# Patient Record
Sex: Male | Born: 1965 | Race: White | Hispanic: No | Marital: Single | Smoking: Former smoker
Health system: Southern US, Community
[De-identification: ages and names within clinical notes are randomized; demographics above are authoritative.]

## PROBLEM LIST (undated history)

## (undated) DIAGNOSIS — I2510 Atherosclerotic heart disease of native coronary artery without angina pectoris: Secondary | ICD-10-CM

## (undated) DIAGNOSIS — I255 Ischemic cardiomyopathy: Secondary | ICD-10-CM

## (undated) DIAGNOSIS — E785 Hyperlipidemia, unspecified: Secondary | ICD-10-CM

## (undated) DIAGNOSIS — I472 Ventricular tachycardia: Secondary | ICD-10-CM

## (undated) DIAGNOSIS — I4729 Other ventricular tachycardia: Secondary | ICD-10-CM

## (undated) HISTORY — PX: KNEE ARTHROSCOPY: SUR90

---

## 2011-09-02 ENCOUNTER — Emergency Department (HOSPITAL_COMMUNITY): Payer: Self-pay

## 2011-09-02 ENCOUNTER — Inpatient Hospital Stay (HOSPITAL_COMMUNITY)
Admission: EM | Admit: 2011-09-02 | Discharge: 2011-09-05 | DRG: 247 | Disposition: A | Discharge status: Home / Self Care | Payer: Self-pay | Attending: Cardiology | Admitting: Cardiology

## 2011-09-02 ENCOUNTER — Encounter (HOSPITAL_COMMUNITY): Payer: Self-pay | Admitting: Emergency Medicine

## 2011-09-02 ENCOUNTER — Other Ambulatory Visit: Payer: Self-pay

## 2011-09-02 DIAGNOSIS — E785 Hyperlipidemia, unspecified: Secondary | ICD-10-CM | POA: Diagnosis present

## 2011-09-02 DIAGNOSIS — E669 Obesity, unspecified: Secondary | ICD-10-CM | POA: Diagnosis present

## 2011-09-02 DIAGNOSIS — I2510 Atherosclerotic heart disease of native coronary artery without angina pectoris: Secondary | ICD-10-CM | POA: Diagnosis present

## 2011-09-02 DIAGNOSIS — I255 Ischemic cardiomyopathy: Secondary | ICD-10-CM

## 2011-09-02 DIAGNOSIS — R7402 Elevation of levels of lactic acid dehydrogenase (LDH): Secondary | ICD-10-CM | POA: Diagnosis not present

## 2011-09-02 DIAGNOSIS — R7401 Elevation of levels of liver transaminase levels: Secondary | ICD-10-CM | POA: Diagnosis not present

## 2011-09-02 DIAGNOSIS — Z7982 Long term (current) use of aspirin: Secondary | ICD-10-CM

## 2011-09-02 DIAGNOSIS — I959 Hypotension, unspecified: Secondary | ICD-10-CM | POA: Diagnosis not present

## 2011-09-02 DIAGNOSIS — Z6829 Body mass index (BMI) 29.0-29.9, adult: Secondary | ICD-10-CM

## 2011-09-02 DIAGNOSIS — I472 Ventricular tachycardia, unspecified: Secondary | ICD-10-CM | POA: Diagnosis not present

## 2011-09-02 DIAGNOSIS — Z8249 Family history of ischemic heart disease and other diseases of the circulatory system: Secondary | ICD-10-CM

## 2011-09-02 DIAGNOSIS — I214 Non-ST elevation (NSTEMI) myocardial infarction: Principal | ICD-10-CM | POA: Diagnosis present

## 2011-09-02 DIAGNOSIS — I2589 Other forms of chronic ischemic heart disease: Secondary | ICD-10-CM | POA: Diagnosis present

## 2011-09-02 DIAGNOSIS — Z720 Tobacco use: Secondary | ICD-10-CM

## 2011-09-02 DIAGNOSIS — F17200 Nicotine dependence, unspecified, uncomplicated: Secondary | ICD-10-CM | POA: Diagnosis present

## 2011-09-02 DIAGNOSIS — Z79899 Other long term (current) drug therapy: Secondary | ICD-10-CM

## 2011-09-02 DIAGNOSIS — I4729 Other ventricular tachycardia: Secondary | ICD-10-CM | POA: Diagnosis not present

## 2011-09-02 DIAGNOSIS — Z7902 Long term (current) use of antithrombotics/antiplatelets: Secondary | ICD-10-CM

## 2011-09-02 HISTORY — DX: Atherosclerotic heart disease of native coronary artery without angina pectoris: I25.10

## 2011-09-02 HISTORY — DX: Ventricular tachycardia: I47.2

## 2011-09-02 HISTORY — DX: Other ventricular tachycardia: I47.29

## 2011-09-02 HISTORY — DX: Hyperlipidemia, unspecified: E78.5

## 2011-09-02 HISTORY — DX: Ischemic cardiomyopathy: I25.5

## 2011-09-02 LAB — BASIC METABOLIC PANEL
BUN: 13 mg/dL (ref 6–23)
Chloride: 104 mEq/L (ref 96–112)
GFR calc Af Amer: >90 mL/min (ref >90)
GFR calc non Af Amer: >90 mL/min (ref >90)
Potassium: 4.3 mEq/L (ref 3.5–5.1)
Sodium: 140 mEq/L (ref 135–145)

## 2011-09-02 LAB — CBC
Hemoglobin: 16.7 g/dL (ref 13.0–17.0)
Hemoglobin: 16.1 g/dL (ref 13.0–17.0)
MCHC: 35.7 g/dL (ref 30.0–36.0)
Platelets: 272 10*3/uL (ref 150–400)
Platelets: 248 10*3/uL (ref 150–400)
RBC: 4.94 MIL/uL (ref 4.22–5.81)
WBC: 19.7 10*3/uL — ABNORMAL HIGH (ref 4.0–10.5)

## 2011-09-02 LAB — CARDIAC PANEL(CRET KIN+CKTOT+MB+TROPI)
CK, MB: 57.8 ng/mL (ref 0.3–4.0)
Relative Index: 8.9 — ABNORMAL HIGH (ref 0.0–2.5)
Total CK: 652 U/L — ABNORMAL HIGH (ref 7–232)

## 2011-09-02 LAB — DIFFERENTIAL
Basophils Absolute: 0 10*3/uL (ref 0.0–0.1)
Basophils Relative: 0 % (ref 0–1)
Monocytes Relative: 5 % (ref 3–12)
Neutro Abs: 17.7 10*3/uL — ABNORMAL HIGH (ref 1.7–7.7)
Neutrophils Relative %: 85 % — ABNORMAL HIGH (ref 43–77)

## 2011-09-02 LAB — TROPONIN I: Troponin I: 0.34 ng/mL (ref <0.30)

## 2011-09-02 MED ORDER — NITROGLYCERIN IN D5W 200-5 MCG/ML-% IV SOLN
3.0000 ug/min | INTRAVENOUS | Status: DC
  Filled 2011-09-02: qty 250

## 2011-09-02 MED ORDER — HEPARIN BOLUS VIA INFUSION
4000.0000 [IU] | Freq: Once | INTRAVENOUS | Status: AC
  Administered 2011-09-02: 4000 [IU] via INTRAVENOUS

## 2011-09-02 MED ORDER — HEPARIN (PORCINE) IN NACL 100-0.45 UNIT/ML-% IJ SOLN
1200.0000 [IU]/h | INTRAMUSCULAR | Status: DC
  Administered 2011-09-02: 1200 [IU]/h via INTRAVENOUS
  Filled 2011-09-02: qty 250

## 2011-09-02 MED ORDER — NITROGLYCERIN 2 % TD OINT
1.0000 [in_us] | TOPICAL_OINTMENT | Freq: Once | TRANSDERMAL | Status: AC
  Administered 2011-09-02: 1 [in_us] via TOPICAL
  Filled 2011-09-02: qty 1

## 2011-09-02 MED ORDER — ATORVASTATIN CALCIUM 10 MG PO TABS
10.0000 mg | ORAL_TABLET | Freq: Every day | ORAL | Status: DC
  Filled 2011-09-02: qty 1

## 2011-09-02 MED ORDER — HEPARIN BOLUS VIA INFUSION: 60.0000 [IU]/kg | Freq: Once | INTRAVENOUS | Status: DC

## 2011-09-02 MED ORDER — HEPARIN (PORCINE) IN NACL 100-0.45 UNIT/ML-% IJ SOLN: 16.0000 [IU]/kg/h | INTRAMUSCULAR | Status: DC

## 2011-09-02 MED ORDER — BIOTENE DRY MOUTH MT LIQD
15.0000 mL | Freq: Two times a day (BID) | OROMUCOSAL | Status: DC
  Administered 2011-09-02 – 2011-09-04 (×5): 15 mL via OROMUCOSAL

## 2011-09-02 MED ORDER — NITROGLYCERIN 0.4 MG SL SUBL: 0.4000 mg | SUBLINGUAL_TABLET | SUBLINGUAL | Status: DC | PRN

## 2011-09-02 MED ORDER — METOPROLOL TARTRATE 25 MG PO TABS
25.0000 mg | ORAL_TABLET | Freq: Two times a day (BID) | ORAL | Status: DC
Start: 2011-09-02 — End: 2011-09-03
  Administered 2011-09-02 – 2011-09-03 (×2): 25 mg via ORAL
  Filled 2011-09-02 (×3): qty 1

## 2011-09-02 MED ORDER — ACETAMINOPHEN 325 MG PO TABS: 325.0000 mg | ORAL_TABLET | Freq: Four times a day (QID) | ORAL | Status: DC | PRN

## 2011-09-02 MED ORDER — ASPIRIN 81 MG PO CHEW
324.0000 mg | CHEWABLE_TABLET | ORAL | Status: AC
  Administered 2011-09-02: 324 mg via ORAL
  Filled 2011-09-02: qty 4

## 2011-09-02 MED ORDER — ASPIRIN EC 81 MG PO TBEC
81.0000 mg | DELAYED_RELEASE_TABLET | Freq: Every day | ORAL | Status: DC
  Administered 2011-09-03 – 2011-09-05 (×3): 81 mg via ORAL
  Filled 2011-09-02 (×3): qty 1

## 2011-09-02 MED ORDER — ONDANSETRON HCL 4 MG/2ML IJ SOLN: 4.0000 mg | Freq: Four times a day (QID) | INTRAMUSCULAR | Status: DC | PRN

## 2011-09-02 NOTE — ED Notes (Signed)
Patient states that he did break out in a sweat with the onset of pain. At this time he is warm and dry, denies nausea and vomiting.

## 2011-09-02 NOTE — ED Provider Notes (Signed)
46 year old male developed chest pressure while doing some heavy lifting. It was severe and rated at 8/10. There no associated symptoms. It has now completely resolved. He does have significant cardiac risk factors of smoking and a positive family history of premature cardiac disease in that his father died at age 66 of an myocardial infarction. ECG does not have any definite ischemic changes although there is T-wave inversion in lead 3. He is a high enough risk patient that he will need to be evaluated in the hospital or in CDU.  ECG shows normal sinus rhythm with a rate of 72, no ectopy. Normal axis. Normal P wave. Normal QRS. Normal intervals. Normal ST and T waves. Impression: normal ECG. No old ECG available for comparison.   Dione Booze, MD 09/02/11 [phone]

## 2011-09-02 NOTE — Progress Notes (Signed)
ANTICOAGULATION CONSULT NOTE - Initial Consult  Pharmacy Consult for Heparin  Indication: chest pain/ACS  No Known Allergies  Patient Measurements: Height: 5\' 11"  (180.3 cm) Weight: 205 lb (92.987 kg) IBW/kg (Calculated) : 75.3  Heparin Dosing Weight: 90 kg  Vital Signs: BP: 121/88 mmHg (04/06 1815) Pulse Rate: 73  (04/06 1815)  Labs:  Basename 09/02/11 1820  HGB 16.7  HCT 46.8  PLT 272  APTT --  LABPROT --  INR --  HEPARINUNFRC --  CREATININE 0.77  CKTOTAL --  CKMB --  TROPONINI --   Estimated Creatinine Clearance: 134.5 ml/min (by C-G formula based on Cr of 0.77).  Medical History: History reviewed. No pertinent past medical history.  Medications:  Scheduled:    . heparin  4,000 Units Intravenous Once  . nitroGLYCERIN  1 inch Topical Once  . DISCONTD: heparin  60 Units/kg Intravenous Once  . DISCONTD: heparin  60 Units/kg Intravenous Once    Assessment: Pt admitted with chest pain. No hx bleeding noted.   Goal of Therapy:  Heparin level 0.3-0.7 units/ml   Plan:  1) Start heparin 4000 units IV bolus x1 then infusion at 1200 units/hr per ACS protocol 2) Check 6 hr heparin level 3) Daily heparin level and CBC with routine platelet monitoring per protocol.  Elson Clan 09/02/2011,7:21 PM

## 2011-09-02 NOTE — ED Notes (Signed)
Received pt from home with c/o intermittant central chest pain after doing heavy lifting of furniture this morning. Episode began around 1230. Pain appears to be positional. Per EMS pt noted to have ECG changes with chest pain episodes.

## 2011-09-02 NOTE — H&P (Signed)
Admit date: 09/02/2011 Referring Physician  Dr. Preston Fleeting  Chief complaint/reason for admission:  Chest pain   HPI: This is a 45yo WM with no PMH but a family history fo CAD with his dad who presented to the ER with complaints of chest pain.  He says that he was in his USOH until 12:30pm and developed SSCP that is a pressure in midsternal without radiation.  He denies any SOB, nausea or vomiting.  He was diaphoretic but was moving furniture at the time.  He thinks the pain lasted about 35 minutes and he called EMS and was given NTG with no relief.  He currently has some minimal chest pain 1/10.   PMH:   History reviewed. No pertinent past medical history.  PSH:    Past Surgical History  Procedure Date  . Knee arthroscopy     ALLERGIES:   Review of patient's allergies indicates no known allergies.  Prior to Admit Meds:   (Not in a hospital admission) Family HX:   History reviewed. No pertinent family history. Social HX:    History   Social History  . Marital Status: Single    Spouse Name: N/A    Number of Children: N/A  . Years of Education: N/A   Occupational History  . Not on file.   Social History Main Topics  . Smoking status: Current Everyday Smoker -- 1.0 packs/day  . Smokeless tobacco: Not on file  . Alcohol Use: No  . Drug Use: No  . Sexually Active:    Other Topics Concern  . Not on file   Social History Narrative  . No narrative on file     ROS:  All 11 ROS were addressed and are negative except what is stated in the HPI  PHYSICAL EXAM Filed Vitals:   09/02/11 1815  BP: 121/88  Pulse: 73  Resp: 16   General: Well developed, well nourished, in no acute distress Head: Eyes PERRLA, No xanthomas.   Normal cephalic and atramatic  Lungs:   Clear bilaterally to auscultation and percussion. Heart:   HRRR S1 S2 Pulses are 2+ & equal.            No carotid bruit. No JVD.  No abdominal bruits. No femoral bruits. Abdomen: Bowel sounds are positive, abdomen soft and  non-tender without masses Extremities:   No clubbing, cyanosis or edema.  DP +1 Neuro: Alert and oriented X 3. Psych:  Good affect, responds appropriately   Labs:   Lab Results  Component Value Date   WBC 19.7* 09/02/2011   HGB 16.1 09/02/2011   HCT 44.9 09/02/2011   MCV 90.9 09/02/2011   PLT 248 09/02/2011    Lab 09/02/11 1820  NA 140  K 4.3  CL 104  CO2 20  BUN 13  CREATININE 0.77  CALCIUM 9.8  PROT --  BILITOT --  ALKPHOS --  ALT --  AST --  GLUCOSE 95   Lab Results  Component Value Date   TROPONINI 0.34* 09/02/2011       Radiology:  *RADIOLOGY REPORT*  Clinical Data: Chest pain. Tobacco use.  CHEST - 2 VIEW  Comparison: None.  Findings: Linear opacities in the lingula, right middle lobe, and  lower lobes favor subsegmental atelectasis or scarring. Low lung  volumes are present, causing crowding of the pulmonary vasculature.  No pleural effusion noted. Cardiac and mediastinal contours appear  unremarkable.  Mild thoracic spondylosis is present.  IMPRESSION:  1. Low lung volumes with linear bibasilar  opacities favoring  subsegmental atelectasis or scarring.   EKG:  NSR with PAC's  ASSESSMENT:  1.  NSTEMI with elevated troponin 2.  Obesity 3.  Family history of CAD  PLAN:   1.  Admit to stepdown 2.  IV Heparin gtt 3.  IV NTG gtt 4.  ASA 5.  Lopressor 25mg  BID 6.  2D echo in am assess LVF 7.  Probable cath on Monday  Quintella Reichert, MD  09/02/2011  8:04 PM

## 2011-09-02 NOTE — ED Provider Notes (Signed)
I saw and evaluated the patient, reviewed the resident's note and I agree with the findings and plan.  CRITICAL CARE Performed by: Dione Booze   Total critical care time: 55 minutes   Critical care time was exclusive of separately billable procedures and treating other patients.  Critical care was necessary to treat or prevent imminent or life-threatening deterioration.  Critical care was time spent personally by me on the following activities: development of treatment plan with patient and/or surrogate as well as nursing, discussions with consultants, evaluation of patient's response to treatment, examination of patient, obtaining history from patient or surrogate, ordering and performing treatments and interventions, ordering and review of laboratory studies, ordering and review of radiographic studies, pulse oximetry and re-evaluation of patient's condition.   Dione Booze, MD 09/02/11 [phone]

## 2011-09-02 NOTE — ED Notes (Signed)
Attempted to collect urine again. Patient states that he "just can't go."

## 2011-09-02 NOTE — ED Provider Notes (Signed)
History     CSN: [id number]  Arrival date & time 09/02/11  1706   First MD Initiated Contact with Patient 09/02/11 1744      Chief Complaint  Patient presents with  . Chest Pain    (Consider location/radiation/quality/duration/timing/severity/associated sxs/prior treatment) HPI CC CP onset today while moving furniture.  The pain is described as a pressure/heaviness in the center of his chest, was nonradiating, it was associated with sob, and was worse with exertion.  He states that he had never had this pain before so he continued to work and the pain and sob got worse.  At the point he decided he needed to call an ambulance, he states he had profuse sweating.  His sx were severe, they improved with rest, ntg and asa.  Full 325mg  asa taken pta.    History reviewed. No pertinent past medical history.  Past Surgical History  Procedure Date  . Knee arthroscopy     History reviewed. No pertinent family history.  History  Substance Use Topics  . Smoking status: Current Everyday Smoker -- 1.0 packs/day for 20 years  . Smokeless tobacco: Not on file  . Alcohol Use: 1.2 oz/week    2 Cans of beer per week      Review of Systems  Respiratory: Positive for shortness of breath.   Cardiovascular: Positive for chest pain.  Gastrointestinal: Negative for nausea and vomiting.  All other systems reviewed and are negative.    Allergies  Review of patient's allergies indicates no known allergies.  Home Medications   No current outpatient prescriptions on file.  BP 106/71  Pulse 83  Temp(Src) 98.4 F (36.9 C) (Oral)  Resp 18  Ht 5\' 11"  (1.803 m)  Wt 207 lb 7.3 oz (94.1 kg)  BMI 28.93 kg/m2  SpO2 95%ra wnl  Physical Exam  Nursing note and vitals reviewed. Constitutional: He appears well-developed and well-nourished.  HENT:  Head: Normocephalic and atraumatic.  Eyes: Right eye exhibits no discharge. Left eye exhibits no discharge.  Neck: Normal range of motion. Neck  supple.  Cardiovascular: Normal rate, regular rhythm and normal heart sounds.   Pulmonary/Chest: Effort normal and breath sounds normal.  Abdominal: Soft. There is no tenderness.  Musculoskeletal: Normal range of motion. He exhibits no tenderness.  Neurological: He is alert.  Skin: Skin is warm and dry.  Psychiatric: He has a normal mood and affect. His behavior is normal.    ED Course  Procedures (including critical care time)  Labs Reviewed  CBC - Abnormal; Notable for the following:    WBC 20.9 (*)    All other components within normal limits  DIFFERENTIAL - Abnormal; Notable for the following:    Neutrophils Relative 85 (*)    Neutro Abs 17.7 (*)    Lymphocytes Relative 9 (*)    Monocytes Absolute 1.1 (*)    All other components within normal limits  TROPONIN I - Abnormal; Notable for the following:    Troponin I 0.34 (*)    All other components within normal limits  CBC - Abnormal; Notable for the following:    WBC 19.7 (*)    All other components within normal limits  CARDIAC PANEL(CRET KIN+CKTOT+MB+TROPI) - Abnormal; Notable for the following:    Total CK 652 (*)    CK, MB 57.8 (*)    Troponin I 12.99 (*)    Relative Index 8.9 (*)    All other components within normal limits  BASIC METABOLIC PANEL  URINE RAPID DRUG  SCREEN (HOSP PERFORMED)  HEPARIN LEVEL (UNFRACTIONATED)  CARDIAC PANEL(CRET KIN+CKTOT+MB+TROPI)  CARDIAC PANEL(CRET KIN+CKTOT+MB+TROPI)  PROTIME-INR  CBC  DIFFERENTIAL  TSH  COMPREHENSIVE METABOLIC PANEL  MAGNESIUM  OCCULT BLOOD X 1 CARD TO LAB, STOOL  HEMOGLOBIN A1C  LIPID PANEL  MRSA PCR SCREENING   Dg Chest 2 View  09/02/2011  *RADIOLOGY REPORT*  Clinical Data: Chest pain.  Tobacco use.  CHEST - 2 VIEW  Comparison: None.  Findings: Linear opacities in the lingula, right middle lobe, and lower lobes favor subsegmental atelectasis or scarring. Low lung volumes are present, causing crowding of the pulmonary vasculature.  No pleural effusion noted.  Cardiac and mediastinal contours appear unremarkable.  Mild thoracic spondylosis is present.  IMPRESSION:  1.  Low lung volumes with linear bibasilar opacities favoring subsegmental atelectasis or scarring.  Original Report Authenticated By: Dellia Cloud, M.D.     No diagnosis found. DIAGNOSES: Non ST-elevation myocardial infarction   MDM  Pt here with good story for acs, pt still with 1/10 cp c/f USA/NSTEMI,   1820 pt told Dr Preston Fleeting that he is pain free, will d/c heparin and wait for labs  1902 pt states pain has returned, getting repeat ekg, starting heparin bolus and infusion, placing ntg paste to cw  Repeat ekg shows no stemi  1957 spoke with Dr Mayford Knife, will see pt in the ED  Dr Mayford Knife will admit  [name], MD 09/02/11 2327

## 2011-09-02 NOTE — Progress Notes (Signed)
Discussed patient with nurse.  He has been having runs of NSVT up to 30 beats.  Given Lopressor but now SBP in the 90's.  No further chest pain but due to ventricular arrhythmias and now Troponin of 13 will take emergently to the cath lab tonight.  Discussed with Dr. Sanjuana Kava.

## 2011-09-02 NOTE — ED Notes (Signed)
Patient unable to urinate for urine sample. Instructed patient to let us know when he has the urge

## 2011-09-02 NOTE — ED Notes (Signed)
Pt had 2 nitro and 324mg  of ASA PTA.

## 2011-09-03 ENCOUNTER — Encounter (HOSPITAL_COMMUNITY)
Admission: EM | Disposition: A | Discharge status: Home / Self Care | Payer: Self-pay | Source: Home / Self Care | Attending: Cardiology

## 2011-09-03 DIAGNOSIS — I2510 Atherosclerotic heart disease of native coronary artery without angina pectoris: Secondary | ICD-10-CM

## 2011-09-03 DIAGNOSIS — I214 Non-ST elevation (NSTEMI) myocardial infarction: Secondary | ICD-10-CM

## 2011-09-03 DIAGNOSIS — I219 Acute myocardial infarction, unspecified: Secondary | ICD-10-CM

## 2011-09-03 HISTORY — PX: PERCUTANEOUS CORONARY STENT INTERVENTION (PCI-S): SHX5485

## 2011-09-03 HISTORY — PX: LEFT HEART CATHETERIZATION WITH CORONARY ANGIOGRAM: SHX5451

## 2011-09-03 LAB — DIFFERENTIAL
Basophils Relative: 0 % (ref 0–1)
Eosinophils Relative: 1 % (ref 0–5)
Lymphocytes Relative: 19 % (ref 12–46)
Monocytes Relative: 8 % (ref 3–12)
Neutrophils Relative %: 72 % (ref 43–77)

## 2011-09-03 LAB — BASIC METABOLIC PANEL
CO2: 22 mEq/L (ref 19–32)
Calcium: 8.6 mg/dL (ref 8.4–10.5)
GFR calc Af Amer: >90 mL/min (ref >90)
GFR calc non Af Amer: >90 mL/min (ref >90)
Sodium: 139 mEq/L (ref 135–145)

## 2011-09-03 LAB — COMPREHENSIVE METABOLIC PANEL
ALT: 30 U/L (ref 0–53)
AST: 130 U/L — ABNORMAL HIGH (ref 0–37)
Albumin: 3.3 g/dL — ABNORMAL LOW (ref 3.5–5.2)
Alkaline Phosphatase: 60 U/L (ref 39–117)
CO2: 22 mEq/L (ref 19–32)
Chloride: 109 mEq/L (ref 96–112)
Creatinine, Ser: 0.75 mg/dL (ref 0.50–1.35)
GFR calc non Af Amer: >90 mL/min (ref >90)
Potassium: 4.1 mEq/L (ref 3.5–5.1)
Sodium: 141 mEq/L (ref 135–145)
Total Bilirubin: 0.5 mg/dL (ref 0.3–1.2)

## 2011-09-03 LAB — TSH: TSH: 1.336 u[IU]/mL (ref 0.350–4.500)

## 2011-09-03 LAB — CBC
Hemoglobin: 13.7 g/dL (ref 13.0–17.0)
Hemoglobin: 12.8 g/dL — ABNORMAL LOW (ref 13.0–17.0)
MCH: 32.1 pg (ref 26.0–34.0)
MCH: 31.5 pg (ref 26.0–34.0)
MCHC: 34.2 g/dL (ref 30.0–36.0)
MCV: 92.1 fL (ref 78.0–100.0)
Platelets: 243 10*3/uL (ref 150–400)
Platelets: 201 10*3/uL (ref 150–400)
RBC: 4.27 MIL/uL (ref 4.22–5.81)
WBC: 11.9 10*3/uL — ABNORMAL HIGH (ref 4.0–10.5)

## 2011-09-03 LAB — CARDIAC PANEL(CRET KIN+CKTOT+MB+TROPI)
Relative Index: 8.4 — ABNORMAL HIGH (ref 0.0–2.5)
Total CK: 1217 U/L — ABNORMAL HIGH (ref 7–232)

## 2011-09-03 LAB — LIPID PANEL
HDL: 33 mg/dL — ABNORMAL LOW (ref >39)
LDL Cholesterol: 143 mg/dL — ABNORMAL HIGH (ref 0–99)
Total CHOL/HDL Ratio: 6.3 RATIO
Triglycerides: 164 mg/dL — ABNORMAL HIGH (ref <150)

## 2011-09-03 LAB — PRO B NATRIURETIC PEPTIDE: Pro B Natriuretic peptide (BNP): 1276 pg/mL — ABNORMAL HIGH (ref 0–125)

## 2011-09-03 LAB — HEMOGLOBIN A1C: Hgb A1c MFr Bld: 5.3 % (ref <5.7)

## 2011-09-03 LAB — PROTIME-INR: Prothrombin Time: 15.8 seconds — ABNORMAL HIGH (ref 11.6–15.2)

## 2011-09-03 SURGERY — LEFT HEART CATHETERIZATION WITH CORONARY ANGIOGRAM
Anesthesia: Moderate Sedation

## 2011-09-03 MED ORDER — BIVALIRUDIN 250 MG IV SOLR
INTRAVENOUS | Status: AC
  Filled 2011-09-03: qty 250

## 2011-09-03 MED ORDER — MIDAZOLAM HCL 2 MG/2ML IJ SOLN
INTRAMUSCULAR | Status: AC
  Filled 2011-09-03: qty 2

## 2011-09-03 MED ORDER — AMIODARONE HCL IN DEXTROSE 360-4.14 MG/200ML-% IV SOLN
0.5000 mg/min | INTRAVENOUS | Status: DC
  Filled 2011-09-03: qty 200

## 2011-09-03 MED ORDER — SODIUM CHLORIDE 0.9 % IV SOLN: 0.2500 mg/kg/h | INTRAVENOUS | Status: AC

## 2011-09-03 MED ORDER — AMIODARONE HCL IN DEXTROSE 360-4.14 MG/200ML-% IV SOLN: 30.0000 mg/h | INTRAVENOUS | Status: DC

## 2011-09-03 MED ORDER — SODIUM CHLORIDE 0.9 % IV SOLN
INTRAVENOUS | Status: AC
  Administered 2011-09-03: 04:00:00 via INTRAVENOUS

## 2011-09-03 MED ORDER — SODIUM CHLORIDE 0.9 % IV SOLN
INTRAVENOUS | Status: DC
  Administered 2011-09-03: 20 mL/h via INTRAVENOUS

## 2011-09-03 MED ORDER — LIDOCAINE HCL (PF) 1 % IJ SOLN
INTRAMUSCULAR | Status: AC
  Filled 2011-09-03: qty 30

## 2011-09-03 MED ORDER — MORPHINE SULFATE 2 MG/ML IJ SOLN: 2.0000 mg | INTRAMUSCULAR | Status: DC | PRN

## 2011-09-03 MED ORDER — AMIODARONE HCL IN DEXTROSE 360-4.14 MG/200ML-% IV SOLN
1.0000 mg/min | INTRAVENOUS | Status: DC
  Administered 2011-09-03 (×2): 1 mg/min via INTRAVENOUS
  Filled 2011-09-03 (×2): qty 200

## 2011-09-03 MED ORDER — AMIODARONE HCL IN DEXTROSE 360-4.14 MG/200ML-% IV SOLN
1.0000 mg/min | INTRAVENOUS | Status: DC
  Filled 2011-09-03: qty 200

## 2011-09-03 MED ORDER — ZOLPIDEM TARTRATE 5 MG PO TABS: 10.0000 mg | ORAL_TABLET | Freq: Every evening | ORAL | Status: DC | PRN

## 2011-09-03 MED ORDER — LISINOPRIL 2.5 MG PO TABS
2.5000 mg | ORAL_TABLET | Freq: Every day | ORAL | Status: DC
  Administered 2011-09-03 – 2011-09-05 (×3): 2.5 mg via ORAL
  Filled 2011-09-03 (×3): qty 1

## 2011-09-03 MED ORDER — TICAGRELOR 90 MG PO TABS
ORAL_TABLET | ORAL | Status: AC
  Filled 2011-09-03: qty 2

## 2011-09-03 MED ORDER — AMIODARONE HCL IN DEXTROSE 360-4.14 MG/200ML-% IV SOLN: 60.0000 mg/h | INTRAVENOUS | Status: DC

## 2011-09-03 MED ORDER — SODIUM CHLORIDE 0.9 % IV BOLUS (SEPSIS)
200.0000 mL | Freq: Once | INTRAVENOUS | Status: AC
  Administered 2011-09-03: 200 mL via INTRAVENOUS

## 2011-09-03 MED ORDER — FENTANYL CITRATE 0.05 MG/ML IJ SOLN
INTRAMUSCULAR | Status: AC
  Filled 2011-09-03: qty 2

## 2011-09-03 MED ORDER — SPIRONOLACTONE 12.5 MG HALF TABLET
12.5000 mg | ORAL_TABLET | Freq: Every day | ORAL | Status: DC
  Administered 2011-09-03: 12.5 mg via ORAL
  Filled 2011-09-03 (×2): qty 1

## 2011-09-03 MED ORDER — OXYCODONE-ACETAMINOPHEN 5-325 MG PO TABS: 1.0000 | ORAL_TABLET | ORAL | Status: DC | PRN

## 2011-09-03 MED ORDER — VERAPAMIL HCL 2.5 MG/ML IV SOLN
INTRAVENOUS | Status: AC
  Filled 2011-09-03: qty 2

## 2011-09-03 MED ORDER — AMIODARONE HCL IN DEXTROSE 360-4.14 MG/200ML-% IV SOLN
60.0000 mg/h | INTRAVENOUS | Status: DC
  Administered 2011-09-03: 60 mg/h via INTRAVENOUS

## 2011-09-03 MED ORDER — CARVEDILOL 6.25 MG PO TABS
6.2500 mg | ORAL_TABLET | Freq: Two times a day (BID) | ORAL | Status: DC
  Administered 2011-09-04 – 2011-09-05 (×4): 6.25 mg via ORAL
  Filled 2011-09-03 (×6): qty 1

## 2011-09-03 MED ORDER — ATORVASTATIN CALCIUM 80 MG PO TABS
80.0000 mg | ORAL_TABLET | Freq: Every day | ORAL | Status: DC
  Administered 2011-09-03 – 2011-09-05 (×3): 80 mg via ORAL
  Filled 2011-09-03 (×3): qty 1

## 2011-09-03 MED ORDER — SODIUM CHLORIDE 0.9 % IV SOLN
INTRAVENOUS | Status: DC
  Administered 2011-09-03: 02:00:00 via INTRAVENOUS

## 2011-09-03 MED ORDER — TICAGRELOR 90 MG PO TABS
90.0000 mg | ORAL_TABLET | Freq: Two times a day (BID) | ORAL | Status: DC
  Administered 2011-09-03 – 2011-09-05 (×4): 90 mg via ORAL
  Filled 2011-09-03 (×5): qty 1

## 2011-09-03 MED ORDER — AMIODARONE IV BOLUS ONLY 150 MG/100ML
150.0000 mg | Freq: Once | INTRAVENOUS | Status: AC
  Administered 2011-09-03: 150 mg via INTRAVENOUS
  Filled 2011-09-03: qty 100

## 2011-09-03 MED ORDER — NITROGLYCERIN 0.2 MG/ML ON CALL CATH LAB
INTRAVENOUS | Status: AC
  Filled 2011-09-03: qty 1

## 2011-09-03 MED ORDER — HEPARIN (PORCINE) IN NACL 2-0.9 UNIT/ML-% IJ SOLN
INTRAMUSCULAR | Status: AC
  Filled 2011-09-03: qty 2000

## 2011-09-03 NOTE — Progress Notes (Signed)
Patient ID: Andrew Brady, male   DOB: 04/11/1966, 46 y.o.   MRN: [id number]    SUBJECTIVE: No chest pain, no dyspnea.  No further runs of NSVT (has had a few short runs of AIVR).  SBP 100s-110s this am.      . amiodarone  150 mg Intravenous Once  . antiseptic oral rinse  15 mL Mouth Rinse BID  . aspirin  324 mg Oral NOW  . aspirin EC  81 mg Oral Daily  . atorvastatin  80 mg Oral q1800  . bivalirudin      . bivalirudin      . fentaNYL      . heparin      . heparin  4,000 Units Intravenous Once  . lidocaine      . metoprolol tartrate  25 mg Oral BID  . midazolam      . nitroGLYCERIN  1 inch Topical Once  . nitroGLYCERIN      . sodium chloride  200 mL Intravenous Once  . Ticagrelor      . Ticagrelor  90 mg Oral BID  . verapamil      . verapamil      . DISCONTD: atorvastatin  10 mg Oral q1800  . DISCONTD: heparin  60 Units/kg Intravenous Once  . DISCONTD: heparin  60 Units/kg Intravenous Once  amiodarone gtt    Filed Vitals:   09/03/11 0600 09/03/11 0630 09/03/11 0700 09/03/11 0800  BP: 102/72 101/70 105/76 98/72  Pulse: 68 69 73 66  Temp:    98.8 F (37.1 C)  TempSrc:    Oral  Resp: 18 16 16 18   Height:      Weight:      SpO2: 96% 96% 96% 96%    Intake/Output Summary (Last 24 hours) at 09/03/11 0957 Last data filed at 09/03/11 0800  Gross per 24 hour  Intake 1060.96 ml  Output    600 ml  Net 460.96 ml    LABS: Basic Metabolic Panel:  Basename 09/03/11 0705 09/02/11 1820  NA 141 140  K 4.1 4.3  CL 109 104  CO2 22 20  GLUCOSE 105* 95  BUN 11 13  CREATININE 0.75 0.77  CALCIUM 8.6 9.8  MG 1.9 --  PHOS -- --   Liver Function Tests:  [facility] 09/03/11 0705  AST 130*  ALT 30  ALKPHOS 60  BILITOT 0.5  PROT 5.9*  ALBUMIN 3.3*   No results found for this basename: LIPASE:2,AMYLASE:2 in the last 72 hours CBC:  Basename 09/03/11 0705 09/02/11 1929 09/02/11 1820  WBC 11.9* 19.7* --  NEUTROABS 8.5* -- 17.7*  HGB 13.7 16.1 --  HCT 39.8 44.9 --    MCV 93.2 90.9 --  PLT 243 248 --   Cardiac Enzymes:  Basename 09/03/11 0705 09/02/11 2202 09/02/11 1823  CKTOTAL 1217* 652* --  CKMB 102.1* 57.8* --  CKMBINDEX -- -- --  TROPONINI >25.00* 12.99* 0.34*   BNP: No components found with this basename: POCBNP:3 D-Dimer: No results found for this basename: DDIMER:2 in the last 72 hours Hemoglobin A1C: No results found for this basename: HGBA1C in the last 72 hours Fasting Lipid Panel:  Basename 09/03/11 0705  CHOL 209*  HDL 33*  LDLCALC 143*  TRIG 164*  CHOLHDL 6.3  LDLDIRECT --   Thyroid Function Tests: No results found for this basename: TSH,T4TOTAL,FREET3,T3FREE,THYROIDAB in the last 72 hours Anemia Panel: No results found for this basename: VITAMINB12,FOLATE,FERRITIN,TIBC,IRON,RETICCTPCT in the last 72 hours  RADIOLOGY: Dg Chest  2 View  09/02/2011  *RADIOLOGY REPORT*  Clinical Data: Chest pain.  Tobacco use.  CHEST - 2 VIEW  Comparison: None.  Findings: Linear opacities in the lingula, right middle lobe, and lower lobes favor subsegmental atelectasis or scarring. Low lung volumes are present, causing crowding of the pulmonary vasculature.  No pleural effusion noted. Cardiac and mediastinal contours appear unremarkable.  Mild thoracic spondylosis is present.  IMPRESSION:  1.  Low lung volumes with linear bibasilar opacities favoring subsegmental atelectasis or scarring.  Original Report Authenticated By: Dellia Cloud, M.D.    PHYSICAL EXAM General: NAD Neck: JVP 8 cm, no thyromegaly or thyroid nodule.  Lungs: Somewhat distant breath sounds.  CV: Nondisplaced PMI.  Heart regular S1/S2, no S3/S4, no murmur.  No peripheral edema.  No carotid bruit.  Normal pedal pulses.  Abdomen: Soft, nontender, no hepatosplenomegaly, no distention.  Neurologic: Alert and oriented x 3.  Psych: Normal affect. Extremities: No clubbing or cyanosis.   TELEMETRY: Reviewed telemetry pt in NSR with a few short runs of AIVR during the  night.   ASSESSMENT AND PLAN:  46 yo smoker presented with large NSTEMI, had frequent NSVT.  Now s/p DES to mid LAD and PTCA to diagonal.  EF 35% on LV-gram.  1. CAD: Large NSTEMI with DES to mLAD and PTCA to D.  Stable with no chest pain.  Continue ASA 81, statin, Brilinta.  2. Ischemic cardiomyopathy: Not dyspneic, possible mild volume overload.  EF 35% with anterior hypokinesis by LV-gram.  - Will stop metoprolol and start Coreg.  - Low-dose ACEI, titrate up as tolerated by BP. - Low-dose spironolactone.  - Echo 3. NSVT: Pre-intervention.  Only brief AIVR since intervention.  Can stop amiodarone.  Echo today.  If EF < 35-40%, would arrange for Lifevest at discharge.   Marca Ancona 09/03/2011 10:03 AM

## 2011-09-03 NOTE — CV Procedure (Signed)
Cardiac Catheterization Operative Report  Andrew Brady [id number] 4/7/20132:11 AM DEFAULT,PROVIDER, MD, MD  Procedure Performed:  1. Left Heart Catheterization 2. Selective Coronary Angiography 3. Left ventricular angiogram 4. PTCA/DES x 1 mid LAD 5. PTCA (balloon only) Diagonal 6. Angioseal RFA  Operator: Verne Carrow, MD  Arterial access site:  Right radial artery and right femoral artery.   Indication: Pt admitted yesterday with chest pain that resolved with NTG. His troponin has been rising over the last 12 hours. He has been having runs of non-sustained VT. He has no chest pain or ST elevation. Given his VT and NSTEMI, we are bringing him to the cath lab for urgent cath.                                      Procedure Details: The risks, benefits, complications, treatment options, and expected outcomes were discussed with the patient. The patient and/or family concurred with the proposed plan, giving informed consent. The patient was brought to the cath lab after IV hydration was begun and oral premedication was given. The patient was further sedated with Versed and Fentanyl. The right wrist was assessed with an Allens test which was positive. The right wrist was prepped and draped in a sterile fashion. 1% lidocaine was used for local anesthesia. Using the modified Seldinger access technique, a 6 French sheath was placed in the right radial artery. 3 mg Verapamil was given through the sheath. A bolus of Angiomax was given and a drip was started.  A JR-4 catheter was used to engage and inject the RCA. I was unable to get any catheters to fit into the left cusp. Because of this, I had to gain access in the right femoral artery with a 6 Fr sheath. This area had been prepped. 1% lidocaine was used for local anesthesia. The left main was engaged with a XB LAD 3.5 guiding catheter. The patient was found to have a severe stenosis of the mid LAD involving the diagonal branch. I  passed a Cougar IC wire down the LAD and another Cougar wire down the LAD. A 2.0 x 12 mm balloon was used to dilate the ostium of the diagonal branch. I then used this same balloon to pre-dilate the LAD. A 2.75 x 16 mm Promus Element DES was deployed in the mid LAD. There was good flow down the Diagonal branch. I suspect there is some plaque there and also a small amount of residual thrombus. I did not want to endanger the flow down the LAD by placing a small stent in the Diagonal. This is a relatively small caliber vessel. I then post-dilated the stent with a 2.75 x 12 mm Hughes balloon x 1. There was a good result with good flow down the LAD and down the Diagonal branch. Both wires were removed. A pigtail catheter was used to perform a left ventricular angiogram. The sheath was removed from the right radial artery and a hemostasis band was applied at the arteriotomy site on the right wrist. An Angioseal was placed in the right femoral artery.   There were no immediate complications. The patient was taken to the recovery area in stable condition.   Hemodynamic Findings: Central aortic pressure: 101/70 Left ventricular pressure: 104/17/24  Angiographic Findings:  Left main:  No obstructive disease.   Left Anterior Descending Artery: Large caliber vessel that courses to the apex. The mid LAD  has a 99% hazy stenosis at the takeoff of the first diagonal branch. The diagonal branch has a hazy, 99% stenosis at the ostium. This is a small to moderate sized vessel.   Circumflex Artery: Moderate sized vessel with no obstructive disease.   Right Coronary Artery: Large, dominant vessel with mild plaque in the mid vessel.   Left Ventricular Angiogram: LVEF=35%. There is hypokinesis of the anterior wall and apex.   Impression: 1. Acute anterior MI 2. Severe stenosis mid LAD involving diagonal branch 3. Successful PTCA/DES x 1 mid LAD 4. Successful PTCA with balloon angioplasty only of the Diagonal branch.    5. Moderate LV systolic dysfunction.   Recommendations: He will need ASA and Brilinta for at least one year. Will continue beta blocker as her tolerates. If BP ok, will need Ace-inhibitor before discharge. Continue statin. Further care per Dr. Mayford Knife.        Complications:  None. The patient tolerated the procedure well.

## 2011-09-03 NOTE — Interval H&P Note (Signed)
History and Physical Interval Note:  09/03/2011 2:04 AM  Andrew Brady  has presented today for cardiac cath with the diagnosis of NSTEMI with rising troponin and ventricular arrythmias. The various methods of treatment have been discussed with the patient and family. After consideration of risks, benefits and other options for treatment, the patient has consented to  Procedure(s) (LRB): LEFT HEART CATHETERIZATION WITH CORONARY ANGIOGRAM (N/A) as a surgical intervention .  The patients' history has been reviewed, patient examined, no change in status, stable for surgery.  I have reviewed the patients' chart and labs.  Questions were answered to the patient's satisfaction.     [name]

## 2011-09-03 NOTE — Progress Notes (Signed)
Responded to code stemi page.  Pt was being examined.  I asked the unit secretary to call me if chaplain assistance was indicated. [employer]  [phone]

## 2011-09-03 NOTE — Progress Notes (Signed)
Notified MD of positive cardiac enzymes and frequent runs of non-sustained vtach.  The longest run measured at this time was 28bts.  Pt denies chest pain, sob and diaphoresis and does appears to be resting comfortably in bed. Will administer evening meds, per md order and will continue to monitor to pain or change in condition.

## 2011-09-03 NOTE — Significant Event (Signed)
CRITICAL VALUE ALERT  Critical value received:  CKMB 57.8, Troponin12.99  Date of notification:  09/02/11  Time of notification: 2205  Critical value read back:yes  Nurse who received alert:  Jeanmarie Plant RN  MD notified (1st page):  Dr. Mayford Knife    Time of first page:  2205  MD notified (2nd page):  Time of second page:  Responding MD:  Dr. Mayford Knife  Time MD responded:  2210

## 2011-09-03 NOTE — Progress Notes (Signed)
  Echocardiogram 2D Echocardiogram has been performed.  Andrew Brady L 09/03/2011, 11:10 AM

## 2011-09-04 DIAGNOSIS — I2510 Atherosclerotic heart disease of native coronary artery without angina pectoris: Secondary | ICD-10-CM

## 2011-09-04 DIAGNOSIS — I255 Ischemic cardiomyopathy: Secondary | ICD-10-CM

## 2011-09-04 DIAGNOSIS — E785 Hyperlipidemia, unspecified: Secondary | ICD-10-CM

## 2011-09-04 DIAGNOSIS — I214 Non-ST elevation (NSTEMI) myocardial infarction: Secondary | ICD-10-CM

## 2011-09-04 DIAGNOSIS — Z720 Tobacco use: Secondary | ICD-10-CM

## 2011-09-04 LAB — COMPREHENSIVE METABOLIC PANEL
ALT: 23 U/L (ref 0–53)
Calcium: 8.5 mg/dL (ref 8.4–10.5)
Creatinine, Ser: 0.69 mg/dL (ref 0.50–1.35)
GFR calc Af Amer: >90 mL/min (ref >90)
GFR calc non Af Amer: >90 mL/min (ref >90)
Glucose, Bld: 90 mg/dL (ref 70–99)
Sodium: 136 mEq/L (ref 135–145)
Total Protein: 6.1 g/dL (ref 6.0–8.3)

## 2011-09-04 LAB — POCT ACTIVATED CLOTTING TIME: Activated Clotting Time: 408 seconds

## 2011-09-04 MED FILL — Dextrose Inj 5%: INTRAVENOUS | Qty: 50 | Status: AC

## 2011-09-04 NOTE — Progress Notes (Signed)
Pt is a 1 ppd smoker and is eager to quit and in action stage. Recommended 21 mg patch to start with. Discussed patch use instructions, how to taper and side effects. Referred to 1-800 quit now for f/u and support. Discussed oral fixation substitutes, second hand smoke and in home smoking policy. Reviewed and gave pt Written education/contact information.

## 2011-09-04 NOTE — Progress Notes (Signed)
Met with patient just prior to his transfer to 3700. He has the [employer] form and the [employer] card. His pharmacy has been contacted ([facility] [address]) and they do have the medication in stock. Sticky note has been left for the physician.

## 2011-09-04 NOTE — Progress Notes (Signed)
UR Completed. Simmons, Daurice Ovando F [phone]  

## 2011-09-04 NOTE — Progress Notes (Signed)
Patient: Andrew Brady Date of Encounter: 09/04/2011, 10:06 AM Admit date: 09/02/2011     Subjective  No CP, SOB. Feels well. Ambulated without problems. BP still running on the low side.   Objective   Telemetry: NSR - ectopy has slowed down. 5 beats NSVT at 9pm, a triplet of PVCs last night ~1 AM but NSR since Physical Exam: Filed Vitals:   09/04/11 0900  BP: 102/70  Pulse: 76  Temp: 98.4  Resp: 19  Pox 94% on RA General: Well developed, well nourished, in no acute distress. Head: Normocephalic, atraumatic, sclera non-icteric, no xanthomas, nares are without discharge.  Neck: Negative for carotid bruits. JVD not elevated. Lungs: Clear bilaterally to auscultation without wheezes, rales, or rhonchi. Breathing is unlabored. Heart: RRR S1 S2 without murmurs, rubs, or gallops.  Abdomen: Soft, non-tender, non-distended with normoactive bowel sounds. No hepatomegaly. No rebound/guarding. No obvious abdominal masses. Msk:  Strength and tone appear normal for age. Extremities: No clubbing or cyanosis. No edema.  Distal pedal pulses are 2+ and equal bilaterally. Neuro: Alert and oriented X 3. Moves all extremities spontaneously. Psych:  Responds to questions appropriately with a normal affect.    Intake/Output Summary (Last 24 hours) at 09/04/11 1006 Last data filed at 09/04/11 1000  Gross per 24 hour  Intake 1116.7 ml  Output   2575 ml  Net -1458.3 ml    Inpatient Medications:    . antiseptic oral rinse  15 mL Mouth Rinse BID  . aspirin EC  81 mg Oral Daily  . atorvastatin  80 mg Oral q1800  . carvedilol  6.25 mg Oral BID WC  . lisinopril  2.5 mg Oral Daily  . spironolactone  12.5 mg Oral Daily  . Ticagrelor  90 mg Oral BID    Labs:  [facility] 09/03/11 1048 09/03/11 0705  NA 139 141  K 3.6 4.1  CL 108 109  CO2 22 22  GLUCOSE 131* 105*  BUN 10 11  CREATININE 0.71 0.75  CALCIUM 8.6 8.6  MG -- 1.9  PHOS -- --    Basename 09/03/11 0705  AST 130*  ALT 30  ALKPHOS  60  BILITOT 0.5  PROT 5.9*  ALBUMIN 3.3*   No results found for this basename: LIPASE:2,AMYLASE:2 in the last 72 hours  Basename 09/03/11 2212 09/03/11 0705 09/02/11 1820  WBC 10.3 11.9* --  NEUTROABS -- 8.5* 17.7*  HGB 12.8* 13.7 --  HCT 37.4* 39.8 --  MCV 92.1 93.2 --  PLT 201 243 --    Basename 09/03/11 0705 09/02/11 2202 09/02/11 1823  CKTOTAL 1217* 652* --  CKMB 102.1* 57.8* --  TROPONINI >25.00* 12.99* 0.34*    Basename 09/03/11 0705  HGBA1C 5.3    Basename 09/03/11 0705  CHOL 209*  HDL 33*  LDLCALC 143*  TRIG 164*  CHOLHDL 6.3    Basename 09/03/11 0705  TSH 1.336  T4TOTAL --  T3FREE --  THYROIDAB --   Radiology/Studies:  1. Chest 2 View 09/02/2011  *RADIOLOGY REPORT*  Clinical Data: Chest pain.  Tobacco use.  CHEST - 2 VIEW  Comparison: None.  Findings: Linear opacities in the lingula, right middle lobe, and lower lobes favor subsegmental atelectasis or scarring. Low lung volumes are present, causing crowding of the pulmonary vasculature.  No pleural effusion noted. Cardiac and mediastinal contours appear unremarkable.  Mild thoracic spondylosis is present.  IMPRESSION:  1.  Low lung volumes with linear bibasilar opacities favoring subsegmental atelectasis or scarring.  Original Report Authenticated By:  Dellia Cloud, M.D.     Assessment and Plan  46 y/o smoker presented to with large NSTEMI & frequent NSVT pre-intervention, was taken to cath lab urgently and had DES to mid LAD & PTCA to diagonal.  1. Newly diagnosed CAD with large NSTEMI with DES to mLAD and PTCA to D  - continue ASA, statin, BB, Brilinta - will ask for case management to assess for med needs [facility] pharmacy availability, assistance)  2. Ischemic cardiomyopathy with EF confirmed at 30% by echo 09/03/11, euvolemic at present. - hypotension currently limits med titration (some held yesterday). Will discontinue spironolactone for now and trend BP on current regimen. May need to decrease  BB. - I have requested assistance to set up LifeVest for patient via Gypsy Balsam EP-RN - with apical akinesis - question to MD: does this patient need consideration for short-term coumadin to prevent LV thrombus?  3. NSVT mostly pre-procedure. Ectopy has slowed, nothing significant since 1am. - will add labs for today to ensure lytes stable - plan LifeVest as above  4. Mild transaminitis likely related to MI - given high-dose statin initiation, will trend with CMET today  5. Hyperlipdemia - LDL 143.  - continue lipitor for now. Patient does not have insurance - I have requested a CM consult for med needs  6. Tobacco abuse, counseled. - formal cessation counseling requested  Dispo: consider transfer out of unit today.  Signed, Ronie Spies PA-C  Patient seen with PA, agree with note.  EF about 30% on echo with evidence for LAD infarction.  Would not hold Coreg or lisinopril as long as SBP is >/= 90.  I think I will hold off coumadin as he is going to be on ASA and Brilinta.  I did not see an apical thrombus on echo.  Care management consult for meds.  Ectopy slowing but still present.  Will plan Lifevest at discharge.   Marca Ancona 09/04/2011 10:42 AM

## 2011-09-04 NOTE — Progress Notes (Signed)
CARDIAC REHAB PHASE I   PRE:  Rate/Rhythm: 84 SR    BP: sitting 99/55    SaO2: 97 RA  MODE:  Ambulation: 350 ft   POST:  Rate/Rhythm: 94 SR    BP: sitting 97/71     SaO2: 98 RA  Tolerated well. No c/o. BP stable. Ed completed. Very receptive. No insurance plan. Gave financial aid application for [facility]. Requests his name be sent to [place] [facility]. Motivated to quit smoking. [id number]  Harriet Masson CES, ACSM

## 2011-09-05 ENCOUNTER — Encounter (HOSPITAL_COMMUNITY): Payer: Self-pay | Admitting: Physician Assistant

## 2011-09-05 LAB — COMPREHENSIVE METABOLIC PANEL
ALT: 23 U/L (ref 0–53)
AST: 43 U/L — ABNORMAL HIGH (ref 0–37)
Albumin: 3.7 g/dL (ref 3.5–5.2)
Alkaline Phosphatase: 64 U/L (ref 39–117)
CO2: 23 mEq/L (ref 19–32)
Chloride: 105 mEq/L (ref 96–112)
Creatinine, Ser: 0.78 mg/dL (ref 0.50–1.35)
GFR calc non Af Amer: >90 mL/min (ref >90)
Potassium: 4.2 mEq/L (ref 3.5–5.1)
Total Bilirubin: 0.6 mg/dL (ref 0.3–1.2)

## 2011-09-05 LAB — CBC
MCH: 32.3 pg (ref 26.0–34.0)
MCHC: 35.3 g/dL (ref 30.0–36.0)
MCV: 91.4 fL (ref 78.0–100.0)
Platelets: 233 10*3/uL (ref 150–400)
RBC: 4.4 MIL/uL (ref 4.22–5.81)
RDW: 12.6 % (ref 11.5–15.5)

## 2011-09-05 MED ORDER — CARVEDILOL 6.25 MG PO TABS: 6.2500 mg | ORAL_TABLET | Freq: Two times a day (BID) | ORAL | Status: DC

## 2011-09-05 MED ORDER — ATORVASTATIN CALCIUM 80 MG PO TABS: 80.0000 mg | ORAL_TABLET | Freq: Every day | ORAL | Status: DC

## 2011-09-05 MED ORDER — NITROGLYCERIN 0.4 MG SL SUBL: 0.4000 mg | SUBLINGUAL_TABLET | SUBLINGUAL | Status: DC | PRN

## 2011-09-05 MED ORDER — TICAGRELOR 90 MG PO TABS: 90.0000 mg | ORAL_TABLET | Freq: Two times a day (BID) | ORAL | Status: DC

## 2011-09-05 MED ORDER — LISINOPRIL 2.5 MG PO TABS: 2.5000 mg | ORAL_TABLET | Freq: Every day | ORAL | Status: DC

## 2011-09-05 MED ORDER — ASPIRIN 81 MG PO TBEC: 81.0000 mg | DELAYED_RELEASE_TABLET | Freq: Every day | ORAL | Status: AC

## 2011-09-05 NOTE — Discharge Summary (Signed)
Agree as outlined. See my progress note this same date.  Andrew Brady 09/05/2011 9:41 PM

## 2011-09-05 NOTE — Discharge Summary (Signed)
Discharge Summary   Patient ID: Andrew Brady MRN: [id number], DOB/AGE: 1965/11/03 46 y.o. Admit date: 09/02/2011 D/C date:     09/05/2011   Primary Discharge Diagnoses:  1. Newly diagnosed CAD with large NSTEMI with DES to mLAD and PTCA to Diag  2. Ischemic cardiomyopathy with EF 30% by echo 09/03/11 - hypotension limiting med titration - will be discharged with LifeVest - will need echo scheduled for 3 months 3. NSVT, mostly pre-procedure, improved with revascularization 4. Hyperlipidemia - will need f/u lipids/LFTS in 6 weeks 6. Tobacco abuse  Hospital Course: 46 y/o M with hx of tobacco abuse & family hx of CAD presented to [facility] with complaints of chest pain. He was in his usual state of health until about 12:30pm when he developed SSCP midsternal without radiation.He denied any SOB, nausea or vomiting. He was diaphoretic but was moving furniture at the time. He thinks the pain lasted 35 minutes and called EMS was given NTG with no relief. EKG showed NSR with PAC's, initial troponin elevated. EKG demonstrated lateral changes. He was started on IV heparin and IV NTG, ASA, lopressor. Shortly after admission she began having NSVT up to 30 beats with hypotension. Next set of enzymes showed a troponin of 13 and because of ongoing ventricular arrhythmias, he was taken emergently to the cath lab and was found to have severe stenosis of the mid LAD involving the diagonal branch, and ultimately had successful PTCA/DES x 1 mid LAD and successful PTCA with balloon angioplasty of the diagonal branch. LV EF was 35% by cath, later confirmed at 30% by echocardiogram. He was started on aspirin and Brilinta, BB and ACEI. His blood pressure unfortunately limited medication titration and she was unable to tolerate addition of spironolactone. His ectopy slowed down significantly after revascularization. He ambulated without difficulty. It was felt he would benefit from Lifevest placement for prevention  of sudden cardiac death. The patient was seen and examined today and felt stable for discharge by Dr. Excell Seltzer today, pending LifeVest placement. He will see Tereso Newcomer PA-C in 2 weeks. At that time if he is stable, he should have an echo in 3 months.  Discharge Vitals: Blood pressure 108/71, pulse 69, temperature 98.1 F (36.7 C), temperature source Oral, resp. rate 20, height 5\' 11"  (1.803 m), weight 212 lb 15.4 oz (96.6 kg), SpO2 97.00%.  Labs: Lab Results  Component Value Date   WBC 8.8 09/05/2011   HGB 14.2 09/05/2011   HCT 40.2 09/05/2011   MCV 91.4 09/05/2011   PLT 233 09/05/2011    Lab 09/05/11 0605  NA 140  K 4.2  CL 105  CO2 23  BUN 10  CREATININE 0.78  CALCIUM 9.3  PROT 6.8  BILITOT 0.6  ALKPHOS 64  ALT 23  AST 43*  GLUCOSE 101*    Basename 09/03/11 0705 09/02/11 2202 09/02/11 1823  CKTOTAL 1217* 652* --  CKMB 102.1* 57.8* --  TROPONINI >25.00* 12.99* 0.34*   Lab Results  Component Value Date   CHOL 209* 09/03/2011   HDL 33* 09/03/2011   LDLCALC 143* 09/03/2011   TRIG 164* 09/03/2011   Diagnostic Studies/Procedures   1. Chest 2 View 09/02/2011  *RADIOLOGY REPORT*  Clinical Data: Chest pain.  Tobacco use.  CHEST - 2 VIEW  Comparison: None.  Findings: Linear opacities in the lingula, right middle lobe, and lower lobes favor subsegmental atelectasis or scarring. Low lung volumes are present, causing crowding of the pulmonary vasculature.  No pleural effusion noted.  Cardiac and mediastinal contours appear unremarkable.  Mild thoracic spondylosis is present.  IMPRESSION:  1.  Low lung volumes with linear bibasilar opacities favoring subsegmental atelectasis or scarring.  Original Report Authenticated By: Dellia Cloud, M.D.   2. 2D echo 09/03/11 Study Conclusions - Left ventricle: The cavity size was normal. Wall thickness was normal. The estimated ejection fraction was 30%. The anteroseptal wall was severely hypokinetic. MId to apical anterior akinesis. MId to apical  anterolateral akinesis. Apical inferior dyskinesis. Akinesis of the true apex. Features are consistent with a pseudonormal left ventricular filling pattern, with concomitant abnormal relaxation and increased filling pressure (grade 2 diastolic dysfunction). - Aortic valve: There was no stenosis. - Mitral valve: Trivial regurgitation. - Right ventricle: The cavity size was normal. Systolic function was normal. - Tricuspid valve: Peak RV-RA gradient: 21mm Hg (S). - Pulmonary arteries: PA peak pressure: 26mm Hg (S). - Inferior vena cava: The vessel was normal in size; the respirophasic diameter changes were in the normal range (= 50%); findings are consistent with normal central venous pressure. Impressions: - Normal LV size with moderate-severe systolic dysfunction, EF 30%. Wall motion abnormalities suggestive of large LAD-territory infarction (description above). Normal RV size and systolic function. No significant valvular abnormalities.   Discharge Medications   Medication List  As of 09/05/2011  2:45 PM   TAKE these medications         aspirin 81 MG EC tablet   Take 1 tablet (81 mg total) by mouth daily.      atorvastatin 80 MG tablet   Commonly known as: LIPITOR   Take 1 tablet (80 mg total) by mouth at bedtime.      carvedilol 6.25 MG tablet   Commonly known as: COREG   Take 1 tablet (6.25 mg total) by mouth 2 (two) times daily with a meal.      lisinopril 2.5 MG tablet   Commonly known as: PRINIVIL,ZESTRIL   Take 1 tablet (2.5 mg total) by mouth daily.      nitroGLYCERIN 0.4 MG SL tablet   Commonly known as: NITROSTAT   Place 1 tablet (0.4 mg total) under the tongue every 5 (five) minutes x 3 doses as needed for chest pain.      Ticagrelor 90 MG Tabs tablet   Commonly known as: BRILINTA   Take 1 tablet (90 mg total) by mouth 2 (two) times daily.      TYLENOL PO   Take 1 tablet by mouth every 6 (six) hours as needed. For headaches.            Disposition   The  patient will be discharged in stable condition to home. Discharge Orders    Future Appointments: Provider: Department: Dept Phone: Center:   09/18/2011 3:20 PM Beatrice Lecher, PA [facility] [phone] [id number]     Future Orders Please Complete By Expires   Diet - low sodium heart healthy      Increase activity slowly      Comments:   No driving for 2 weeks. No heavy lifting for 4 weeks. No sexual activity for 4 weeks. You may not return to work until cleared by your cardiologist. Keep procedure site clean & dry. If you notice increased pain, swelling, bleeding or pus, call/return!  You may shower, but no soaking baths/hot tubs/pools for 1 week.       Follow-up Information    Follow up with Tereso Newcomer, PA. (At Dr. Gibson Ramp office on 09/18/11 at 3:20pm)  Contact information:   [address] [place] [address] [phone]            Duration of Discharge Encounter: Greater than 30 minutes including physician and PA time.  Signed, Ronie Spies PA-C 09/05/2011, 2:45 PM

## 2011-09-05 NOTE — Progress Notes (Signed)
    Subjective:  Feels great wants to go home. No chest pain or dyspnea.  Objective:  Vital Signs in the last 24 hours: Temp:  [98.1 F (36.7 C)-98.5 F (36.9 C)] 98.1 F (36.7 C) (04/09 0600) Pulse Rate:  [56-74] 69  (04/09 0801) Resp:  [20] 20  (04/09 0600) BP: (100-126)/(61-82) 108/71 mmHg (04/09 1025) SpO2:  [97 %-100 %] 97 % (04/09 0600)  Intake/Output from previous day: 04/08 0701 - 04/09 0700 In: 480 [P.O.:480] Out: 1450 [Urine:1450]  Physical Exam: Pt is alert and oriented, NAD HEENT: normal Neck: JVP - normal, carotids 2+= without bruits Lungs: CTA bilaterally CV: RRR without murmur or gallop Abd: soft, NT, Positive BS, no hepatomegaly Ext: no C/C/E, distal pulses intact and equal. Right radial and groin sites are clear. Skin: warm/dry no rash   Lab Results:  Basename 09/05/11 0605 09/03/11 2212  WBC 8.8 10.3  HGB 14.2 12.8*  PLT 233 201    Basename 09/05/11 0605 09/04/11 1030  NA 140 136  K 4.2 4.1  CL 105 104  CO2 23 24  GLUCOSE 101* 90  BUN 10 9  CREATININE 0.78 0.69    Basename 09/03/11 0705 09/02/11 2202  TROPONINI >25.00* 12.99*   Tele: sinus rhythm - no VT  Assessment/Plan:  1. NSTEMI - now s/p PCI of the LAD (DES) 2. Severe LV dysfunction secondary to #1 (LVEF 30) 3. NSVT - improved  4. Hyperlipidemia 5. Tobacco abuse  Pt is stable for discharge. He is tolerating a medical regimen of ASA, Brilinta, Coreg, and Lisinopril. He is on a statin drug. Pharmacy has been contacted and meds are in stock. He is on generics except for Brilinta and he has 30 day card for this. We are awaiting placement of a LifeVest considering severe LV dysfunction. He will need 2 week follow-up with Tereso Newcomer, then ongoing follow-up with Dr Clifton James. Repeat echo in 3 months. OK to discharge tonight after LifeVest placed.  Tonny Bollman, M.D. 09/05/2011, 11:32 AM

## 2011-09-05 NOTE — Progress Notes (Signed)
D/c orders received; IVs removed with gauze on; pt remains in stable condition; pt meds and instructions reviewed and given to pt; reviewed/gave HF packet with pt; pt placed on lifevest prior to d/c; pt d/c to home

## 2011-09-18 ENCOUNTER — Encounter: Payer: Self-pay | Admitting: *Deleted

## 2011-09-18 ENCOUNTER — Encounter: Payer: Self-pay | Admitting: Physician Assistant

## 2011-09-18 ENCOUNTER — Ambulatory Visit (INDEPENDENT_AMBULATORY_CARE_PROVIDER_SITE_OTHER): Payer: Self-pay | Admitting: Physician Assistant

## 2011-09-18 VITALS — BP 110/80 | HR 74 | Ht 71.0 in | Wt 217.0 lb

## 2011-09-18 DIAGNOSIS — I2589 Other forms of chronic ischemic heart disease: Secondary | ICD-10-CM

## 2011-09-18 DIAGNOSIS — E785 Hyperlipidemia, unspecified: Secondary | ICD-10-CM

## 2011-09-18 DIAGNOSIS — I2510 Atherosclerotic heart disease of native coronary artery without angina pectoris: Secondary | ICD-10-CM

## 2011-09-18 MED ORDER — SPIRONOLACTONE 25 MG PO TABS: 12.5000 mg | ORAL_TABLET | Freq: Every day | ORAL | Status: DC

## 2011-09-18 NOTE — Patient Instructions (Signed)
Your physician recommends that you schedule a follow-up appointment in: 4-6 weeks  Your physician has recommended you make the following change in your medication: START Spironolactone 25 mg 1/2 tab daily Your physician recommends that you return for lab work in: 5-7 days (BMP) and in 4-6 weeks for Lipid and Liver profile  Call if you get close to running out of Brilinta

## 2011-09-18 NOTE — Progress Notes (Signed)
[address] [place], [place]  [address] Phone: [phone] Fax:  [phone]  Date:  09/18/2011   Name:  Andrew Brady       DOB:  [birth year] MRN:  [id number]  PCP:  Sheila Oats, MD, MD  Primary Cardiologist:  Dr. Verne Carrow  Primary Electrophysiologist:  None    History of Present Illness: Andrew Brady is a 46 y.o. male who presents for post hospital follow up.  He was admitted 4/6-4/9 with a NSTEMI.  He presented with chest discomfort and his troponin peaked to greater than 25.  LHC 4/7: Mid LAD 99%, ostial diagonal 99%.  EF 35%, anterior and apical hypokinesis.  PCI: 2.75 x 16 mm Promus DES to the mid LAD and POBA to the diagonal branch.  Echocardiogram 09/03/11: EF 30%, anteroseptal hypokinesis, mid to apical anterior akinesis, mid to apical anterolateral akinesis, apical inferior dyskinesis and akinesis of the true apex, grade 2 diastolic dysfunction, trivial MR, PASP 26.  The patient had NSVT up to 30 beats with associated hypotension prior to going to the cath lab and undergoing PCI.  In light of this and his reduced LV function, he was placed on Lifevest.  Of note, BP soft in the hospital and spironolactone was not continued.    Labs: Hemoglobin 14.2, potassium 4.2, creatinine 0.78, ALT 23, TSH 1.336, A1c 5.3, total cholesterol 209, HDL 33, LDL 143, triglycerides 164.  Overall doing well.  The patient denies chest pain, shortness of breath, syncope, orthopnea, PND or significant pedal edema.  He is worried he cannot afford Brilinta.  He has not faxed the form in to [employer].  Otherwise, tolerating medications.  He quit smoking!  Past Medical History  Diagnosis Date  . Coronary artery disease     large NSTEMI 08/2011 with DES to mLAD and PTCA to Diag .  [name] Ischemic cardiomyopathy     EF 30% by echo 08/2011 with hypotension limiting medication titration, discharged with LifeVest  . NSVT (nonsustained ventricular tachycardia)    Improved after revascularization  . Hyperlipidemia     Current Outpatient Prescriptions  Medication Sig Dispense Refill  . Acetaminophen (TYLENOL PO) Take 1 tablet by mouth every 6 (six) hours as needed. For headaches.      [name] aspirin EC 81 MG EC tablet Take 1 tablet (81 mg total) by mouth daily.      [name] atorvastatin (LIPITOR) 80 MG tablet Take 1 tablet (80 mg total) by mouth at bedtime.  30 tablet  6  . carvedilol (COREG) 6.25 MG tablet Take 1 tablet (6.25 mg total) by mouth 2 (two) times daily with a meal.  60 tablet  6  . lisinopril (PRINIVIL,ZESTRIL) 2.5 MG tablet Take 1 tablet (2.5 mg total) by mouth daily.  30 tablet  6  . nitroGLYCERIN (NITROSTAT) 0.4 MG SL tablet Place 1 tablet (0.4 mg total) under the tongue every 5 (five) minutes x 3 doses as needed for chest pain.  25 tablet  4  . Ticagrelor (BRILINTA) 90 MG TABS tablet Take 1 tablet (90 mg total) by mouth 2 (two) times daily.  60 tablet  6  . spironolactone (ALDACTONE) 25 MG tablet Take 0.5 tablets (12.5 mg total) by mouth daily.  45 tablet  2    Allergies: No Known Allergies  History  Substance Use Topics  . Smoking status: Former Smoker -- 1.0 packs/day for 20 years  . Smokeless tobacco: Not on file   Comment: Quit September 02, 2011  .  Alcohol Use: 1.2 oz/week    2 Cans of beer per week     ROS:  Please see the history of present illness.    All other systems reviewed and negative.   PHYSICAL EXAM: VS:  BP 110/80  Pulse 74  Ht 5\' 11"  (1.803 m)  Wt 217 lb (98.431 kg)  BMI 30.27 kg/m2 Well nourished, well developed, in no acute distress HEENT: normal Neck: no JVD Cardiac:  normal S1, S2; RRR; no murmur Lungs:  clear to auscultation bilaterally, no wheezing, rhonchi or rales Abd: soft, nontender, no hepatomegaly Ext: no edema; right wrist and right groin without hematoma or bruit Skin: warm and dry Neuro:  CNs 2-12 intact, no focal abnormalities noted  EKG:  NSR, HR 74, normal axis, TW inversion 1, aVL, no  significant change from prior tracing   ASSESSMENT AND PLAN:  1.  CAD Doing ok s/p MI.  Tolerating medications.  He will fill out paperwork for [employer].  We gave him samples today as well.  We discussed the importance of dual antiplatelet therapy.   2.  Ischemic Cardiomyopathy Continue ACE and beta blocker.  Add spironolactone 12.5 mg QD.  Check BMET in 5-7 days.  Follow up with Dr. Verne Carrow in 4-6 weeks.  Continue Lifevest for now.  Consider follow up echo at next visit to recheck LVF.  3.  NSVT Remains on Lifevest.  He should not be driving now and we had a discussion about this and why.  I gave him a letter to take to his work to hopefully let him work from home.    4.  Hyperlipidemia Check L/L in 6 weeks.   Signed, Tereso Newcomer, PA-C  4:18 PM 09/18/2011

## 2011-09-25 ENCOUNTER — Other Ambulatory Visit (INDEPENDENT_AMBULATORY_CARE_PROVIDER_SITE_OTHER): Payer: Self-pay

## 2011-09-25 ENCOUNTER — Telehealth: Payer: Self-pay | Admitting: *Deleted

## 2011-09-25 DIAGNOSIS — I2510 Atherosclerotic heart disease of native coronary artery without angina pectoris: Secondary | ICD-10-CM

## 2011-09-25 DIAGNOSIS — E785 Hyperlipidemia, unspecified: Secondary | ICD-10-CM

## 2011-09-25 DIAGNOSIS — I2589 Other forms of chronic ischemic heart disease: Secondary | ICD-10-CM

## 2011-09-25 DIAGNOSIS — R0989 Other specified symptoms and signs involving the circulatory and respiratory systems: Secondary | ICD-10-CM

## 2011-09-25 LAB — BASIC METABOLIC PANEL
BUN: 11 mg/dL (ref 6–23)
CO2: 27 mEq/L (ref 19–32)
Calcium: 9.5 mg/dL (ref 8.4–10.5)
GFR: 110.29 mL/min (ref >60.00)
Glucose, Bld: 88 mg/dL (ref 70–99)
Sodium: 140 mEq/L (ref 135–145)

## 2011-09-25 LAB — LIPID PANEL
HDL: 46.7 mg/dL (ref >39.00)
Total CHOL/HDL Ratio: 4

## 2011-09-25 LAB — HEPATIC FUNCTION PANEL
ALT: 32 U/L (ref 0–53)
AST: 25 U/L (ref 0–37)
Bilirubin, Direct: 0.1 mg/dL (ref 0.0–0.3)
Total Bilirubin: 0.3 mg/dL (ref 0.3–1.2)

## 2011-09-25 NOTE — Telephone Encounter (Signed)
lmom bmet ok and ok to stay on spironolactone. we will credit pt for L/L that were done in error, these were not supposed to be done for about 2 months, only bmet was supposed to be done today.

## 2011-09-25 NOTE — Telephone Encounter (Signed)
Message copied by Tarri Fuller on Mon Sep 25, 2011  3:32 PM ------      Message from: [place], [place] T      Created: Mon Sep 25, 2011  2:48 PM       Credit patient's account for Lipids and LFTs - these were not supposed to be done yet      BMET ok - ok to continue Spironolactone      Tereso Newcomer, PA-C  2:48 PM 09/25/2011

## 2011-10-16 ENCOUNTER — Ambulatory Visit (INDEPENDENT_AMBULATORY_CARE_PROVIDER_SITE_OTHER): Payer: Self-pay | Admitting: *Deleted

## 2011-10-16 DIAGNOSIS — I2510 Atherosclerotic heart disease of native coronary artery without angina pectoris: Secondary | ICD-10-CM

## 2011-10-16 DIAGNOSIS — E785 Hyperlipidemia, unspecified: Secondary | ICD-10-CM

## 2011-10-16 LAB — HEPATIC FUNCTION PANEL
AST: 24 U/L (ref 0–37)
Bilirubin, Direct: 0 mg/dL (ref 0.0–0.3)
Total Bilirubin: 0.8 mg/dL (ref 0.3–1.2)

## 2011-10-16 LAB — LIPID PANEL
HDL: 42.3 mg/dL (ref >39.00)
Total CHOL/HDL Ratio: 3
VLDL: 11 mg/dL (ref 0.0–40.0)

## 2011-10-17 ENCOUNTER — Ambulatory Visit (INDEPENDENT_AMBULATORY_CARE_PROVIDER_SITE_OTHER): Payer: Self-pay | Admitting: Cardiovascular Disease

## 2011-10-17 ENCOUNTER — Encounter: Payer: Self-pay | Admitting: Cardiovascular Disease

## 2011-10-17 VITALS — BP 130/86 | HR 96 | Ht 71.0 in | Wt 220.0 lb

## 2011-10-17 DIAGNOSIS — I2510 Atherosclerotic heart disease of native coronary artery without angina pectoris: Secondary | ICD-10-CM

## 2011-10-17 DIAGNOSIS — I2589 Other forms of chronic ischemic heart disease: Secondary | ICD-10-CM

## 2011-10-17 DIAGNOSIS — E785 Hyperlipidemia, unspecified: Secondary | ICD-10-CM

## 2011-10-17 DIAGNOSIS — I255 Ischemic cardiomyopathy: Secondary | ICD-10-CM

## 2011-10-17 NOTE — Assessment & Plan Note (Signed)
Lipids well controlled on statin.

## 2011-10-17 NOTE — Assessment & Plan Note (Addendum)
STable post DES in mid LAD, PTCA diagonal in setting of NSTEMI 09/03/11. Will continue ASA, Brilinta, statin, beta blocker, Ace-inh, aldactone. He will continue Brilinta for total of 3 months then will call in and we will switch to Plavix.

## 2011-10-17 NOTE — Progress Notes (Signed)
History of Present Illness: 46 yo WM with history of CAD, HLD, ischemic CM who is here today for cardiac follow up. He was admitted 4/6-09/05/11 to [facility] with a NSTEMI. He presented with chest discomfort and his troponin peaked to greater than 25. LHC 4/7: Mid LAD 99%, ostial diagonal 99%. EF 35%, anterior and apical hypokinesis. PCI: 2.75 x 16 mm Promus DES to the mid LAD and POBA to the diagonal branch. Echocardiogram 09/03/11: EF 30%, anteroseptal hypokinesis, mid to apical anterior akinesis, mid to apical anterolateral akinesis, apical inferior dyskinesis and akinesis of the true apex, grade 2 diastolic dysfunction, trivial MR, PASP 26. The patient had NSVT up to 30 beats with associated hypotension prior to going to the cath lab and undergoing PCI. In light of this and his reduced LV function, he was placed on Lifevest. Of note, BP soft in the hospital and spironolactone was not continued. He was seen by Tereso Newcomer, PA-C 09/18/11 and was doing well.   He is here today for follow up. Overall doing well. The patient denies chest pain, shortness of breath, syncope, orthopnea, PND or significant pedal edema. Tolerating medications. He quit smoking. He does not have insurance and wants to d/c LifeVest. Does not want to consider ICD.   Primary Care Physician: None  Last Lipid Profile:  Lipid Panel     Component Value Date/Time   CHOL 134 10/16/2011 0904   TRIG 55.0 10/16/2011 0904   HDL 42.30 10/16/2011 0904   CHOLHDL 3 10/16/2011 0904   VLDL 11.0 10/16/2011 0904   LDLCALC 81 10/16/2011 0904      Past Medical History  Diagnosis Date  . Coronary artery disease     large NSTEMI 08/2011 with DES to mLAD and PTCA to Diag .  [name] Ischemic cardiomyopathy     EF 30% by echo 08/2011 with hypotension limiting medication titration, discharged with LifeVest  . NSVT (nonsustained ventricular tachycardia)     Improved after revascularization  . Hyperlipidemia     Past Surgical History    Procedure Date  . Knee arthroscopy     Current Outpatient Prescriptions  Medication Sig Dispense Refill  . Acetaminophen (TYLENOL PO) Take 1 tablet by mouth every 6 (six) hours as needed. For headaches.      [name] aspirin EC 81 MG EC tablet Take 1 tablet (81 mg total) by mouth daily.      [name] atorvastatin (LIPITOR) 80 MG tablet Take 1 tablet (80 mg total) by mouth at bedtime.  30 tablet  6  . carvedilol (COREG) 6.25 MG tablet Take 1 tablet (6.25 mg total) by mouth 2 (two) times daily with a meal.  60 tablet  6  . lisinopril (PRINIVIL,ZESTRIL) 2.5 MG tablet Take 1 tablet (2.5 mg total) by mouth daily.  30 tablet  6  . nitroGLYCERIN (NITROSTAT) 0.4 MG SL tablet Place 1 tablet (0.4 mg total) under the tongue every 5 (five) minutes x 3 doses as needed for chest pain.  25 tablet  4  . spironolactone (ALDACTONE) 25 MG tablet Take 0.5 tablets (12.5 mg total) by mouth daily.  45 tablet  2  . Ticagrelor (BRILINTA) 90 MG TABS tablet Take 1 tablet (90 mg total) by mouth 2 (two) times daily.  60 tablet  6    No Known Allergies  History   Social History  . Marital Status: Single    Spouse Name: N/A    Number of Children: N/A  . Years of Education: N/A  Occupational History  . Not on file.   Social History Main Topics  . Smoking status: Former Smoker -- 1.0 packs/day for 20 years  . Smokeless tobacco: Not on file   Comment: Quit September 02, 2011  . Alcohol Use: 1.2 oz/week    2 Cans of beer per week  . Drug Use: No  . Sexually Active: Not on file   Other Topics Concern  . Not on file   Social History Narrative  . No narrative on file    No family history on file.  Review of Systems:  As stated in the HPI and otherwise negative.   BP 130/86  Pulse 96  Ht 5\' 11"  (1.803 m)  Wt 220 lb (99.791 kg)  BMI 30.68 kg/m2  Physical Examination: General: Well developed, well nourished, NAD HEENT: OP clear, mucus membranes moist SKIN: warm, dry. No rashes. Neuro: No focal  deficits Musculoskeletal: Muscle strength 5/5 all ext Psychiatric: Mood and affect normal Neck: No JVD, no carotid bruits, no thyromegaly, no lymphadenopathy. Lungs:Clear bilaterally, no wheezes, rhonci, crackles Cardiovascular: Regular rate and rhythm. No murmurs, gallops or rubs. Abdomen:Soft. Bowel sounds present. Non-tender.  Extremities: No lower extremity edema. Pulses are 2 + in the bilateral DP/PT.

## 2011-10-17 NOTE — Assessment & Plan Note (Signed)
LVEF 30% following cath. He has been wearing a Lifevest. He is asking to remove. He does not wish to consider ICD until after January 2014. Will get echo to evaluate LVEF. If less than 35%, will recommend ICD. Continue medical therapy. Will remove LifeVest. He understands risk of Sudden cardiac death and accepts this risk.

## 2011-10-17 NOTE — Patient Instructions (Signed)
Your physician wants you to follow-up in: 7-8 months.  You will receive a reminder letter in the mail two months in advance. If you don't receive a letter, please call our office to schedule the follow-up appointment.  Your physician has requested that you have an echocardiogram. Echocardiography is a painless test that uses sound waves to create images of your heart. It provides your doctor with information about the size and shape of your heart and how well your heart's chambers and valves are working. This procedure takes approximately one hour. There are no restrictions for this procedure.   You may discontinue life vest.  Call the toll free number for company and they will send you a box with instructions on how to return it.

## 2011-11-06 ENCOUNTER — Ambulatory Visit (HOSPITAL_COMMUNITY): Payer: Self-pay | Admitting: Radiology

## 2011-11-06 ENCOUNTER — Telehealth: Payer: Self-pay | Admitting: *Deleted

## 2011-11-06 DIAGNOSIS — I252 Old myocardial infarction: Secondary | ICD-10-CM | POA: Insufficient documentation

## 2011-11-06 DIAGNOSIS — E669 Obesity, unspecified: Secondary | ICD-10-CM | POA: Insufficient documentation

## 2011-11-06 DIAGNOSIS — I509 Heart failure, unspecified: Secondary | ICD-10-CM | POA: Insufficient documentation

## 2011-11-06 DIAGNOSIS — I2589 Other forms of chronic ischemic heart disease: Secondary | ICD-10-CM | POA: Insufficient documentation

## 2011-11-06 DIAGNOSIS — I2510 Atherosclerotic heart disease of native coronary artery without angina pectoris: Secondary | ICD-10-CM

## 2011-11-06 DIAGNOSIS — I5020 Unspecified systolic (congestive) heart failure: Secondary | ICD-10-CM | POA: Insufficient documentation

## 2011-11-06 DIAGNOSIS — I517 Cardiomegaly: Secondary | ICD-10-CM | POA: Insufficient documentation

## 2011-11-06 DIAGNOSIS — E785 Hyperlipidemia, unspecified: Secondary | ICD-10-CM | POA: Insufficient documentation

## 2011-11-06 MED ORDER — CLOPIDOGREL BISULFATE 75 MG PO TABS: 75.0000 mg | ORAL_TABLET | Freq: Every day | ORAL | Status: AC

## 2011-11-06 NOTE — Progress Notes (Signed)
Echocardiogram performed.  

## 2011-11-06 NOTE — Telephone Encounter (Signed)
That is ok with me. cdm 

## 2011-11-06 NOTE — Telephone Encounter (Signed)
Received staff message from echo technician that pt asked about being changed to Plavix when here for echo today and asked for phone call.  Last office visit note indicates pt to continue Brilinta for 3 months and then change to Plavix. I spoke with pt and he is aware of this and has been taking Brilinta.  He would like to change on December 03, 2011 to Plavix.  He is aware to continue aspirin and that Plavix is once daily.  He would like printed prescription mailed to his home.

## 2012-04-30 ENCOUNTER — Other Ambulatory Visit: Payer: Self-pay

## 2012-04-30 MED ORDER — LISINOPRIL 2.5 MG PO TABS: 2.5000 mg | ORAL_TABLET | Freq: Every day | ORAL | Status: DC

## 2012-06-04 ENCOUNTER — Other Ambulatory Visit: Payer: Self-pay

## 2012-06-04 MED ORDER — LISINOPRIL 2.5 MG PO TABS: 2.5000 mg | ORAL_TABLET | Freq: Every day | ORAL | Status: DC

## 2012-06-04 MED ORDER — CARVEDILOL 6.25 MG PO TABS: 6.2500 mg | ORAL_TABLET | Freq: Two times a day (BID) | ORAL | Status: DC

## 2012-06-24 ENCOUNTER — Other Ambulatory Visit: Payer: Self-pay | Admitting: Physician Assistant

## 2012-06-28 ENCOUNTER — Other Ambulatory Visit: Payer: Self-pay

## 2012-06-28 MED ORDER — ATORVASTATIN CALCIUM 80 MG PO TABS: 80.0000 mg | ORAL_TABLET | Freq: Every day | ORAL | Status: DC

## 2012-09-09 ENCOUNTER — Other Ambulatory Visit: Payer: Self-pay | Admitting: Cardiovascular Disease

## 2013-01-22 ENCOUNTER — Other Ambulatory Visit: Payer: Self-pay | Admitting: Cardiovascular Disease

## 2013-02-23 ENCOUNTER — Other Ambulatory Visit: Payer: Self-pay | Admitting: Cardiovascular Disease

## 2013-03-12 ENCOUNTER — Other Ambulatory Visit: Payer: Self-pay | Admitting: Cardiovascular Disease

## 2013-04-05 IMAGING — CR DG CHEST 2V
2 series · 2 of 2 positions shown · non-contrast
Comparison: None.

CLINICAL DATA: Chest pain.  Tobacco use.

CHEST - 2 VIEW

[w chest pa]
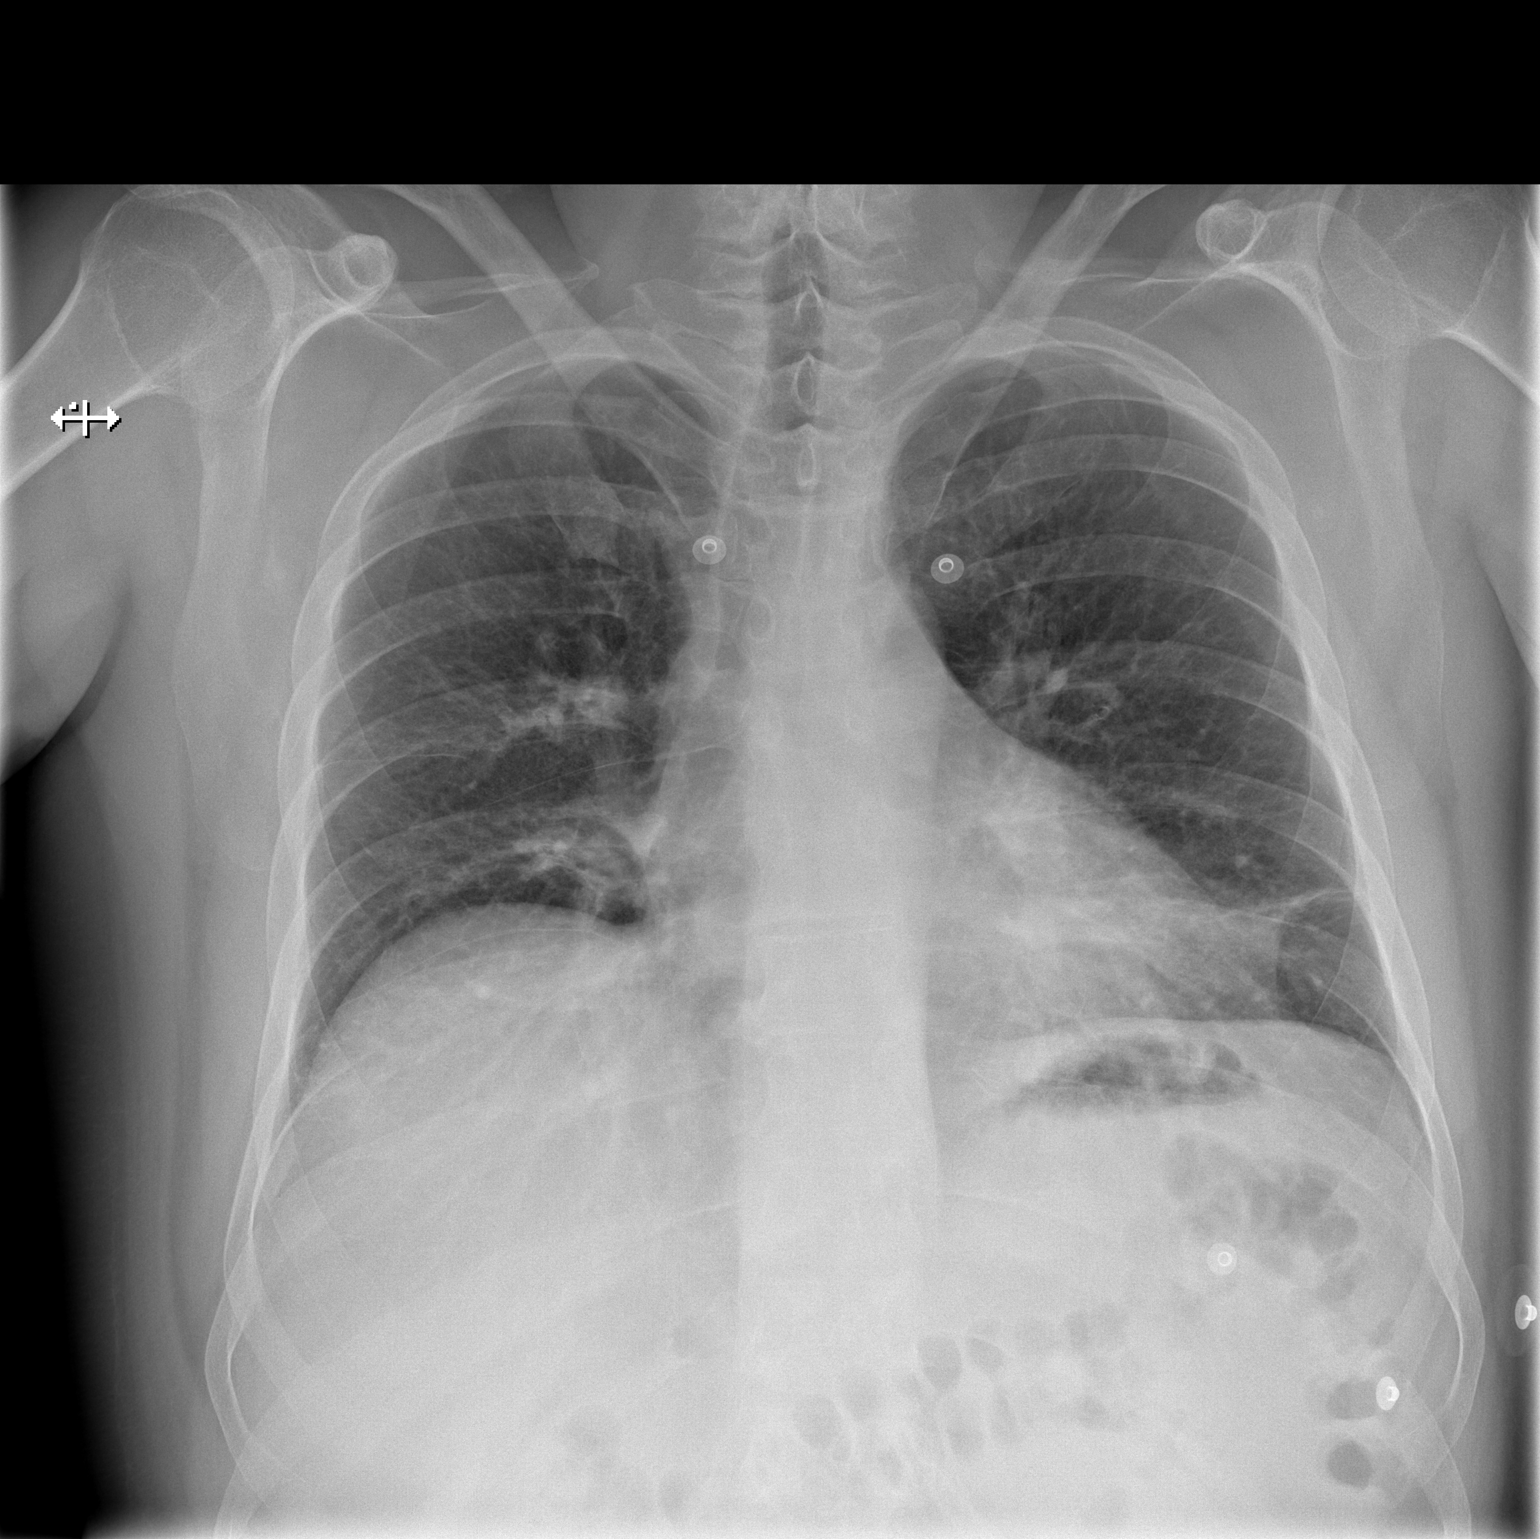

[w chest lat]
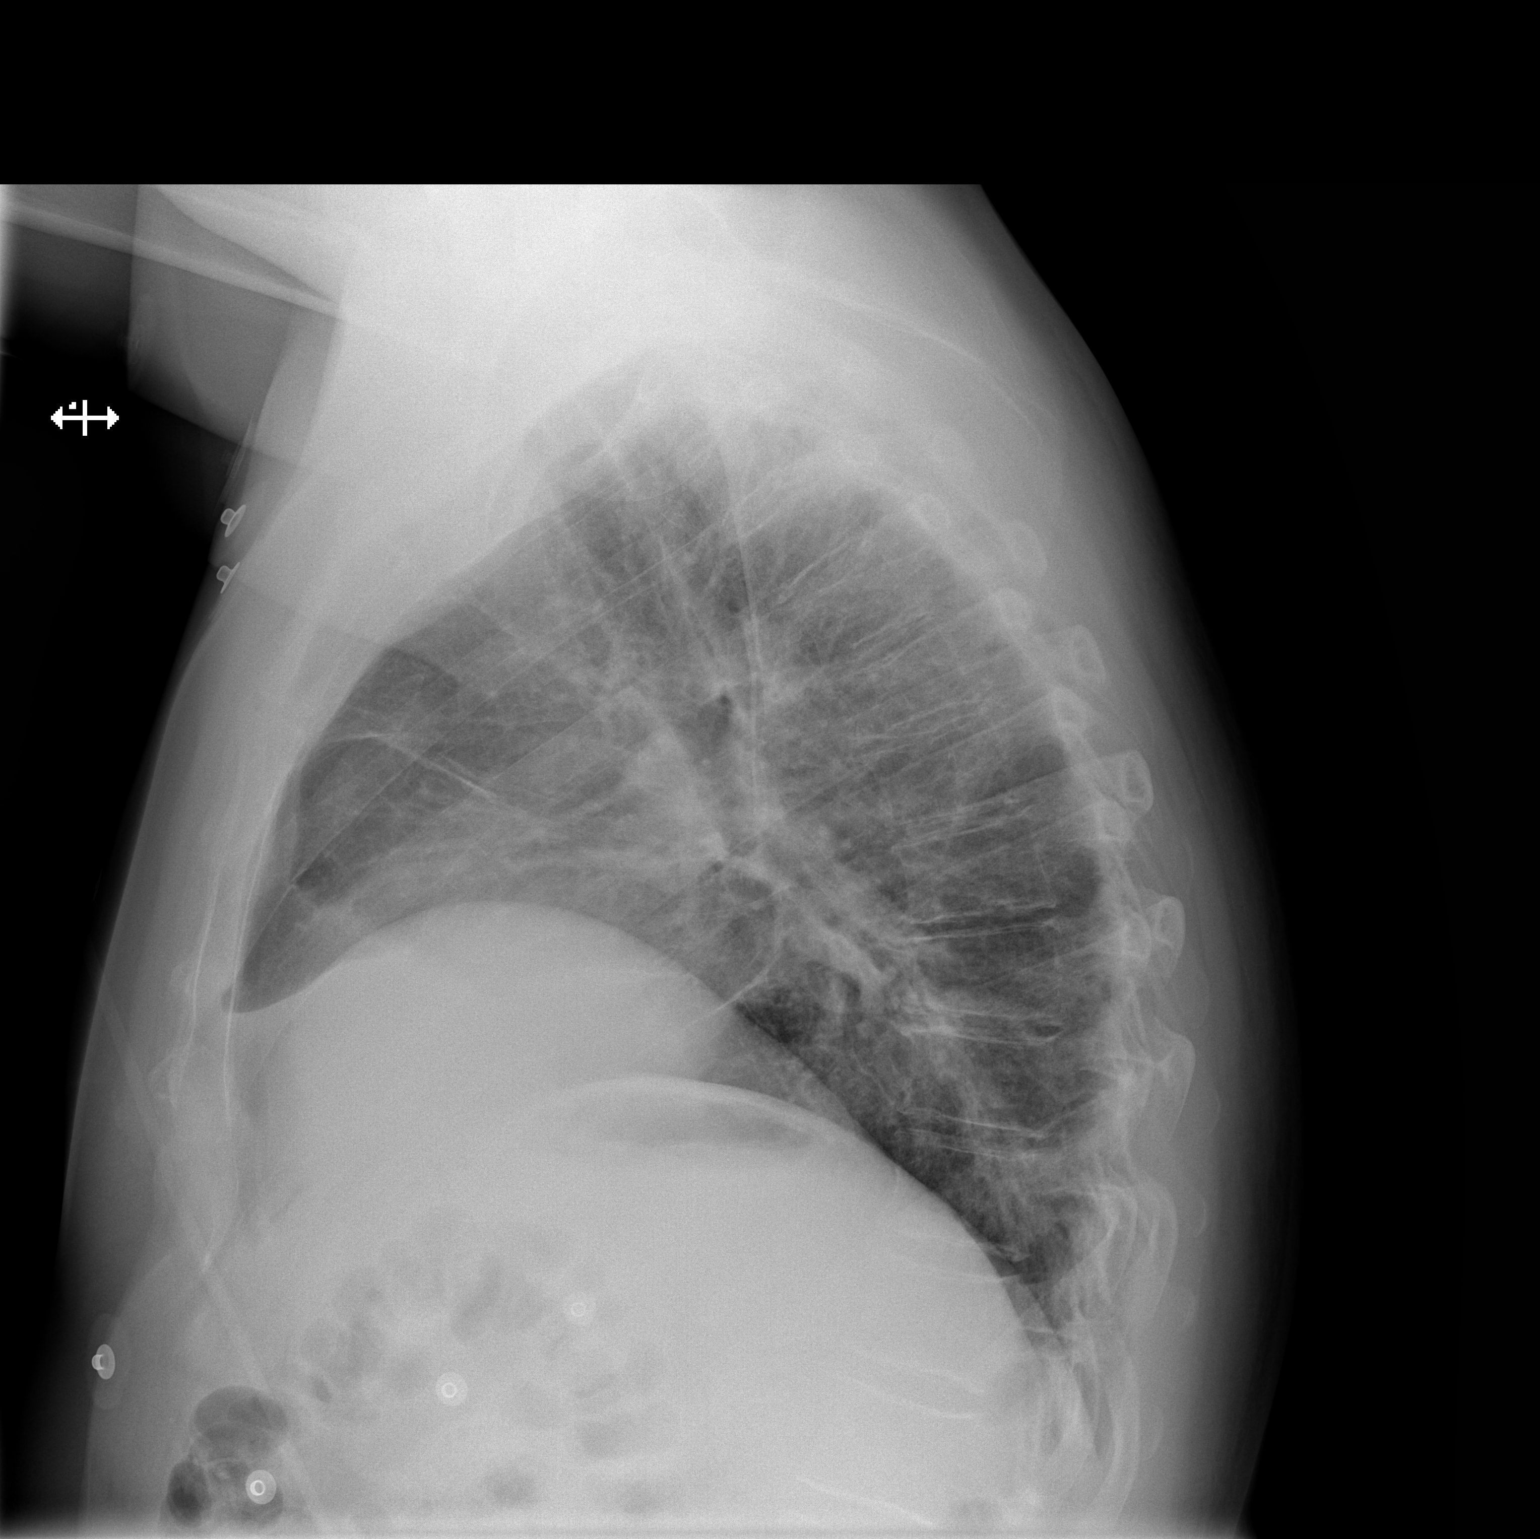

[2 of 2 positions shown; findings below may reference images not displayed]

FINDINGS: Linear opacities in the lingula, right middle lobe, and
lower lobes favor subsegmental atelectasis or scarring. Low lung
volumes are present, causing crowding of the pulmonary vasculature.

No pleural effusion noted. Cardiac and mediastinal contours appear
unremarkable.

Mild thoracic spondylosis is present.
IMPRESSION: 1.  Low lung volumes with linear bibasilar opacities favoring
subsegmental atelectasis or scarring.

## 2013-04-28 ENCOUNTER — Other Ambulatory Visit: Payer: Self-pay | Admitting: Cardiovascular Disease

## 2014-05-07 ENCOUNTER — Encounter (HOSPITAL_COMMUNITY): Payer: Self-pay | Admitting: Cardiovascular Disease

## 2014-06-10 ENCOUNTER — Encounter (HOSPITAL_BASED_OUTPATIENT_CLINIC_OR_DEPARTMENT_OTHER): Payer: Self-pay | Admitting: Emergency Medicine

## 2014-06-10 ENCOUNTER — Emergency Department (HOSPITAL_BASED_OUTPATIENT_CLINIC_OR_DEPARTMENT_OTHER): Payer: BLUE CROSS/BLUE SHIELD

## 2014-06-10 ENCOUNTER — Emergency Department (HOSPITAL_BASED_OUTPATIENT_CLINIC_OR_DEPARTMENT_OTHER)
Admission: EM | Admit: 2014-06-10 | Discharge: 2014-06-10 | Disposition: A | Discharge status: Home / Self Care | Payer: BLUE CROSS/BLUE SHIELD | Attending: Emergency Medicine | Admitting: Emergency Medicine

## 2014-06-10 DIAGNOSIS — Z87891 Personal history of nicotine dependence: Secondary | ICD-10-CM | POA: Insufficient documentation

## 2014-06-10 DIAGNOSIS — R0789 Other chest pain: Secondary | ICD-10-CM | POA: Insufficient documentation

## 2014-06-10 DIAGNOSIS — E785 Hyperlipidemia, unspecified: Secondary | ICD-10-CM | POA: Diagnosis not present

## 2014-06-10 DIAGNOSIS — Z79899 Other long term (current) drug therapy: Secondary | ICD-10-CM | POA: Insufficient documentation

## 2014-06-10 DIAGNOSIS — I2510 Atherosclerotic heart disease of native coronary artery without angina pectoris: Secondary | ICD-10-CM | POA: Diagnosis not present

## 2014-06-10 DIAGNOSIS — Z8673 Personal history of transient ischemic attack (TIA), and cerebral infarction without residual deficits: Secondary | ICD-10-CM | POA: Insufficient documentation

## 2014-06-10 DIAGNOSIS — I159 Secondary hypertension, unspecified: Secondary | ICD-10-CM | POA: Insufficient documentation

## 2014-06-10 DIAGNOSIS — Z7902 Long term (current) use of antithrombotics/antiplatelets: Secondary | ICD-10-CM | POA: Diagnosis not present

## 2014-06-10 DIAGNOSIS — R079 Chest pain, unspecified: Secondary | ICD-10-CM | POA: Diagnosis present

## 2014-06-10 LAB — COMPREHENSIVE METABOLIC PANEL
ALT: 21 U/L (ref 0–53)
AST: 23 U/L (ref 0–37)
Albumin: 4.5 g/dL (ref 3.5–5.2)
Alkaline Phosphatase: 48 U/L (ref 39–117)
Anion gap: 6 (ref 5–15)
BUN: 10 mg/dL (ref 6–23)
CALCIUM: 9.5 mg/dL (ref 8.4–10.5)
CHLORIDE: 108 meq/L (ref 96–112)
CO2: 27 mmol/L (ref 19–32)
Creatinine, Ser: 0.73 mg/dL (ref 0.50–1.35)
GFR calc Af Amer: >90 mL/min (ref >90)
GFR calc non Af Amer: >90 mL/min (ref >90)
Glucose, Bld: 95 mg/dL (ref 70–99)
POTASSIUM: 4.1 mmol/L (ref 3.5–5.1)
SODIUM: 141 mmol/L (ref 135–145)
Total Bilirubin: 0.7 mg/dL (ref 0.3–1.2)
Total Protein: 7.4 g/dL (ref 6.0–8.3)

## 2014-06-10 LAB — CBC WITH DIFFERENTIAL/PLATELET
Basophils Absolute: 0 10*3/uL (ref 0.0–0.1)
Basophils Relative: 0 % (ref 0–1)
Eosinophils Absolute: 0 10*3/uL (ref 0.0–0.7)
Eosinophils Relative: 1 % (ref 0–5)
HCT: 45.1 % (ref 39.0–52.0)
HEMOGLOBIN: 15.4 g/dL (ref 13.0–17.0)
LYMPHS ABS: 1.6 10*3/uL (ref 0.7–4.0)
LYMPHS PCT: 24 % (ref 12–46)
MCH: 31.1 pg (ref 26.0–34.0)
MCHC: 34.1 g/dL (ref 30.0–36.0)
MCV: 91.1 fL (ref 78.0–100.0)
MONOS PCT: 9 % (ref 3–12)
Monocytes Absolute: 0.6 10*3/uL (ref 0.1–1.0)
NEUTROS PCT: 66 % (ref 43–77)
Neutro Abs: 4.6 10*3/uL (ref 1.7–7.7)
PLATELETS: 240 10*3/uL (ref 150–400)
RBC: 4.95 MIL/uL (ref 4.22–5.81)
RDW: 12.1 % (ref 11.5–15.5)
WBC: 6.8 10*3/uL (ref 4.0–10.5)

## 2014-06-10 LAB — TROPONIN I: Troponin I: <0.03 ng/mL (ref <0.031)

## 2014-06-10 MED ORDER — CLOPIDOGREL BISULFATE 75 MG PO TABS: 75.0000 mg | ORAL_TABLET | Freq: Every day | ORAL | Status: AC

## 2014-06-10 MED ORDER — CARVEDILOL 6.25 MG PO TABS: 6.2500 mg | ORAL_TABLET | Freq: Two times a day (BID) | ORAL | Status: AC

## 2014-06-10 MED ORDER — ASPIRIN 81 MG PO CHEW
324.0000 mg | CHEWABLE_TABLET | Freq: Once | ORAL | Status: AC
  Administered 2014-06-10: 324 mg via ORAL
  Filled 2014-06-10: qty 4

## 2014-06-10 MED ORDER — ENALAPRILAT 1.25 MG/ML IV SOLN
1.2500 mg | Freq: Once | INTRAVENOUS | Status: AC
  Administered 2014-06-10: 1.25 mg via INTRAVENOUS
  Filled 2014-06-10: qty 2

## 2014-06-10 MED ORDER — ATORVASTATIN CALCIUM 80 MG PO TABS: 80.0000 mg | ORAL_TABLET | Freq: Every day | ORAL | Status: AC

## 2014-06-10 MED ORDER — CARVEDILOL 6.25 MG PO TABS
6.2500 mg | ORAL_TABLET | Freq: Two times a day (BID) | ORAL | Status: DC
  Filled 2014-06-10: qty 1

## 2014-06-10 MED ORDER — LISINOPRIL 20 MG PO TABS: 20.0000 mg | ORAL_TABLET | Freq: Every day | ORAL | Status: AC

## 2014-06-10 NOTE — ED Provider Notes (Signed)
CSN: [id number]     Arrival date & time 06/10/14  0941 History   First MD Initiated Contact with Patient 06/10/14 [phone]     Chief Complaint  Patient presents with  . Chest Pain      HPI  Patient presents for evaluation of left chest pain, and left facial "tightness".  She describes on symptoms 8:30 this morning while at work sitting at his desk. Systems 3 sudden sharp "jabs" all a few seconds apart in his left anterior lateral chest. Each one lasting 1-2 seconds. He states that after this he felt "tightening" in the left side of his face for a few seconds as well. He has had no recurrence of any of these symptoms in his chest. He states he has had one or 2 episodes where his felt tight again for a few seconds.  No symptoms longer than a few seconds. No shortness of breath. No sensation of palpitations. Not lightheaded or dizzy. No dyspnea shortness of breath. No neck or back jaw arm back or abdominal pain.  Patient describes a "heart attack" in 2013. I reviewed his records. He had a LAD and diagonal stent placed. He did follow up with Dr. Camillo Flaming for 2 months afterwards. Then is lost to follow-up because of "no insurance". States he is currently insured. Liver, has been off of medications for several months.  He did have an EF of 30 when he was hospitalized, and wore a life vest. Was encouraged and instructed to consider AICD placement. He declined at that time. He's had no episodes of syncope/palpitations.  Patient walks 2-3 miles 2-3 days per week it has no symptoms with this.    Past Medical History  Diagnosis Date  . Coronary artery disease     large NSTEMI 08/2011 with DES to mLAD and PTCA to Diag .  [name] Ischemic cardiomyopathy     EF 30% by echo 08/2011 with hypotension limiting medication titration, discharged with LifeVest  . NSVT (nonsustained ventricular tachycardia)     Improved after revascularization  . Hyperlipidemia    Past Surgical History  Procedure Laterality Date  .  Knee arthroscopy    . Left heart catheterization with coronary angiogram N/A 09/03/2011    Procedure: LEFT HEART CATHETERIZATION WITH CORONARY ANGIOGRAM;  Surgeon: Kathleene Hazel, MD;  Location: [facility] CATH LAB;  Service: Cardiovascular;  Laterality: N/A;  . Percutaneous coronary stent intervention (pci-s)  09/03/2011    Procedure: PERCUTANEOUS CORONARY STENT INTERVENTION (PCI-S);  Surgeon: Kathleene Hazel, MD;  Location: [facility] CATH LAB;  Service: Cardiovascular;;   History reviewed. No pertinent family history. History  Substance Use Topics  . Smoking status: Former Smoker -- 1.00 packs/day for 20 years  . Smokeless tobacco: Not on file     Comment: Quit September 02, 2011  . Alcohol Use: 1.2 oz/week    2 Cans of beer per week    Review of Systems  Constitutional: Negative for fever, chills, diaphoresis, appetite change and fatigue.  HENT: Negative for mouth sores, sore throat and trouble swallowing.        Facial "tightness"  Eyes: Negative for visual disturbance.  Respiratory: Negative for cough, chest tightness, shortness of breath and wheezing.   Cardiovascular: Positive for chest pain.  Gastrointestinal: Negative for nausea, vomiting, abdominal pain, diarrhea and abdominal distention.  Endocrine: Negative for polydipsia, polyphagia and polyuria.  Genitourinary: Negative for dysuria, frequency and hematuria.  Musculoskeletal: Negative for gait problem.  Skin: Negative for color change, pallor and rash.  Neurological: Negative for dizziness, syncope, light-headedness and headaches.  Hematological: Does not bruise/bleed easily.  Psychiatric/Behavioral: Negative for behavioral problems and confusion.      Allergies  Review of patient's allergies indicates no known allergies.  Home Medications   Prior to Admission medications   Medication Sig Start Date End Date Taking? Authorizing Provider  Acetaminophen (TYLENOL PO) Take 1 tablet by mouth every 6 (six) hours as needed. For  headaches.    Historical Provider, MD  atorvastatin (LIPITOR) 80 MG tablet Take 1 tablet (80 mg total) by mouth daily. 06/10/14   Rolland PorterMark Damarcus Reggio, MD  carvedilol (COREG) 6.25 MG tablet Take 1 tablet (6.25 mg total) by mouth 2 (two) times daily with a meal. 06/10/14   Rolland PorterMark Shyne Lehrke, MD  clopidogrel (PLAVIX) 75 MG tablet Take 1 tablet (75 mg total) by mouth daily. 06/10/14   Rolland PorterMark Jabria Loos, MD  lisinopril (PRINIVIL,ZESTRIL) 20 MG tablet Take 1 tablet (20 mg total) by mouth daily. 06/10/14   Rolland PorterMark Ruperto Kiernan, MD  spironolactone (ALDACTONE) 25 MG tablet TAKE 0.5 TABLETS BY MOUTH DAILY. 06/24/12   Kathleene Hazelhristopher D McAlhany, MD  spironolactone (ALDACTONE) 25 MG tablet TAKE ONE-HALF TABLET BY MOUTH DAILY. 03/12/13   Kathleene Hazelhristopher D McAlhany, MD   BP 127/87 mmHg  Pulse 70  Temp(Src) 97.8 F (36.6 C) (Oral)  Resp 18  SpO2 100% Physical Exam  Constitutional: He is oriented to person, place, and time. He appears well-developed and well-nourished. No distress.  HENT:  Head: Normocephalic.  Eyes: Conjunctivae are normal. Pupils are equal, round, and reactive to light. No scleral icterus.  Neck: Normal range of motion. Neck supple. No thyromegaly present.  Cardiovascular: Normal rate and regular rhythm.  Exam reveals no gallop and no friction rub.   No murmur heard. Pulmonary/Chest: Effort normal and breath sounds normal. No respiratory distress. He has no wheezes. He has no rales.  Abdominal: Soft. Bowel sounds are normal. He exhibits no distension. There is no tenderness. There is no rebound.  Musculoskeletal: Normal range of motion.  Neurological: He is alert and oriented to person, place, and time.  Skin: Skin is warm and dry. No rash noted.  Psychiatric: He has a normal mood and affect. His behavior is normal.    ED Course  Procedures (including critical care time) Labs Review Labs Reviewed  CBC WITH DIFFERENTIAL  TROPONIN I  COMPREHENSIVE METABOLIC PANEL  TROPONIN I    Imaging Review Dg Chest 2  View  06/10/2014   CLINICAL DATA:  Left-sided chest pain beginning this morning.  EXAM: CHEST  2 VIEW  COMPARISON:  PA and lateral chest 09/02/2011.  FINDINGS: Eventration of the right hemidiaphragm is again seen, unchanged. The lungs are clear. No pneumothorax or pleural effusion. No focal bony abnormality.  IMPRESSION: No acute disease.   Electronically Signed   By: Drusilla Kannerhomas  Dalessio M.D.   On: 06/10/2014 10:15     EKG Interpretation   Date/Time:  Wednesday June 10 2014 09:46:58 EST Ventricular Rate:  89 PR Interval:  136 QRS Duration: 80 QT Interval:  346 QTC Calculation: 420 R Axis:   43 Text Interpretation:  Normal sinus rhythm with sinus arrhythmia Normal ECG  Confirmed by Fayrene FearingJAMES  MD, Ronnel Zuercher ([id number]) on 06/10/2014 9:53:08 AM Also  confirmed by Fayrene FearingJAMES  MD, Jonte Wollam ([id number])  on 06/10/2014 10:26:12 AM      MDM   Final diagnoses:  Chest pain, unspecified chest pain type  Secondary hypertension, unspecified    Fleeting episodes of sharp discomfort in the left anterior  chest only a few seconds at a time. Doubt that this is cardiac. Does not describe palpitations. No ectopy on his monitor here. Normal initial EKG. I do not feel he will need admission should his enzymes prove normal. He remains asymptomatic here. Awaiting first and second enzymes. Given aspirin. Given IV Vasotec.  15:00:  Patient leans a symptomatically. Second enzymes normal. Refills on his Plavix, Lipitor, Coreg, lisinopril. Encourage cartilage in primary care follow-up. Return if any worsening. I do not feel his symptoms today are consistent with acute coronary syndrome or angina.    Rolland Porter, MD 06/10/14 1452

## 2014-06-10 NOTE — Discharge Instructions (Signed)

## 2014-06-10 NOTE — ED Notes (Signed)
Patient transported to X-ray 

## 2014-06-10 NOTE — ED Notes (Signed)
Pt reports 3-4 quick sharp pains in his left chest this am and his left face felt tight. Pt denies SOB, nausea, dizzy, or diaphoresis.

## 2014-06-10 NOTE — ED Notes (Signed)
MD at bedside.
